# Patient Record
Sex: Male | Born: 1955 | Race: White | Hispanic: No | Marital: Single | Smoking: Former smoker
Health system: Southern US, Community
[De-identification: ages and names within clinical notes are randomized; demographics above are authoritative.]

## PROBLEM LIST (undated history)

## (undated) DIAGNOSIS — K644 Residual hemorrhoidal skin tags: Secondary | ICD-10-CM

## (undated) DIAGNOSIS — R7611 Nonspecific reaction to tuberculin skin test without active tuberculosis: Secondary | ICD-10-CM

## (undated) HISTORY — PX: OTHER SURGICAL HISTORY: SHX169

## (undated) HISTORY — PX: LEG SURGERY: SHX1003

## (undated) HISTORY — PX: EYE SURGERY: SHX253

---

## 2011-09-13 ENCOUNTER — Emergency Department (HOSPITAL_COMMUNITY)

## 2011-09-13 ENCOUNTER — Emergency Department (HOSPITAL_COMMUNITY)
Admission: EM | Admit: 2011-09-13 | Discharge: 2011-09-14 | Attending: Emergency Medicine | Admitting: Emergency Medicine

## 2011-09-13 ENCOUNTER — Other Ambulatory Visit: Payer: Self-pay

## 2011-09-13 DIAGNOSIS — R0602 Shortness of breath: Secondary | ICD-10-CM | POA: Insufficient documentation

## 2011-09-13 DIAGNOSIS — R002 Palpitations: Secondary | ICD-10-CM

## 2011-09-13 DIAGNOSIS — Z87891 Personal history of nicotine dependence: Secondary | ICD-10-CM | POA: Insufficient documentation

## 2011-09-13 DIAGNOSIS — J45909 Unspecified asthma, uncomplicated: Secondary | ICD-10-CM | POA: Insufficient documentation

## 2011-09-13 DIAGNOSIS — R079 Chest pain, unspecified: Secondary | ICD-10-CM | POA: Insufficient documentation

## 2011-09-13 HISTORY — DX: Nonspecific reaction to tuberculin skin test without active tuberculosis: R76.11

## 2011-09-13 HISTORY — DX: Residual hemorrhoidal skin tags: K64.4

## 2011-09-13 LAB — COMPREHENSIVE METABOLIC PANEL
ALT: 12 U/L (ref 0–53)
Alkaline Phosphatase: 52 U/L (ref 39–117)
CO2: 28 mEq/L (ref 19–32)
GFR calc Af Amer: >90 mL/min (ref >90)
GFR calc non Af Amer: 83 mL/min — ABNORMAL LOW (ref >90)
Glucose, Bld: 105 mg/dL — ABNORMAL HIGH (ref 70–99)
Potassium: 4 mEq/L (ref 3.5–5.1)
Sodium: 134 mEq/L — ABNORMAL LOW (ref 135–145)
Total Bilirubin: 0.4 mg/dL (ref 0.3–1.2)

## 2011-09-13 LAB — DIFFERENTIAL
Eosinophils Relative: 4 % (ref 0–5)
Lymphocytes Relative: 41 % (ref 12–46)
Lymphs Abs: 3.6 10*3/uL (ref 0.7–4.0)
Neutrophils Relative %: 47 % (ref 43–77)

## 2011-09-13 LAB — CBC
MCV: 90.2 fL (ref 78.0–100.0)
Platelets: 259 10*3/uL (ref 150–400)
RBC: 4.91 MIL/uL (ref 4.22–5.81)
WBC: 8.8 10*3/uL (ref 4.0–10.5)

## 2011-09-13 NOTE — ED Provider Notes (Signed)
History     CSN: [id number] Arrival date & time: 09/13/2011 10:10 PM   First MD Initiated Contact with Patient 09/13/11 2308      Chief Complaint  Patient presents with  . Hypertension  . Shortness of Breath    (Consider location/radiation/quality/duration/timing/severity/associated sxs/prior treatment) HPI Comments: Patient who is an inmate at a local prison,  c/o intermittent chest pain and "feeling like my heart is racing" several times throughout the day.  States that at 6 pm tonight the sx's became more persistent and he also noticed some shortness of breath during that time.  States that his blood pressure was also elevated.  He denies previous cardiac disease, nausea or vomiting, weakness or diaphoresis.  Also states that he was upset today because his girlfriend did not visit him today.    Patient is a 55 y.o. male presenting with palpitations. The history is provided by the patient.  Palpitations  This is a new problem. The current episode started 3 to 5 hours ago. Episode frequency: intermittently. The problem has been gradually improving. The problem is associated with an unknown factor. Associated symptoms include chest pain, irregular heartbeat and shortness of breath. Pertinent negatives include no diaphoresis, no fever, no numbness, no chest pressure, no near-syncope, no orthopnea, no syncope, no abdominal pain, no nausea, no vomiting, no headaches, no back pain, no leg pain, no lower extremity edema, no dizziness, no weakness, no cough and no hemoptysis. He has tried nothing for the symptoms. The treatment provided no relief. Risk factors include being male. His past medical history does not include heart disease or valve disorder.    Past Medical History  Diagnosis Date  . Hemorrhoids, external   . 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 Asthma     Past Surgical History  Procedure Date  . Eye surgery   . Blood clot      removed from left lung    No family history  on file.  History  Substance Use Topics  . Smoking status: Former Games developer  . Smokeless tobacco: Not on file  . Alcohol Use: Not on file      Review of Systems  Constitutional: Negative for fever, chills, diaphoresis and fatigue.  HENT: Negative for sore throat, trouble swallowing, neck pain and neck stiffness.   Respiratory: Positive for shortness of breath. Negative for cough, hemoptysis, chest tightness and wheezing.   Cardiovascular: Positive for chest pain and palpitations. Negative for orthopnea, leg swelling, syncope and near-syncope.  Gastrointestinal: Negative for nausea, vomiting, abdominal pain and blood in stool.  Genitourinary: Negative for dysuria, hematuria and flank pain.  Musculoskeletal: Negative for myalgias, back pain and arthralgias.  Skin: Negative for rash.  Neurological: Negative for dizziness, syncope, weakness, numbness and headaches.  Hematological: Does not bruise/bleed easily.  All other systems reviewed and are negative.    Allergies  Review of patient's allergies indicates no known allergies.  Home Medications   Current Outpatient Rx  Name Route Sig Dispense Refill  . IPRATROPIUM-ALBUTEROL 18-103 MCG/ACT IN AERO Inhalation Inhale 2 puffs into the lungs every 6 (six) hours as needed. For shortness of breath     . IBUPROFEN 200 MG PO TABS Oral Take 400 mg by mouth as needed. For pain       BP 126/93  Pulse 79  Temp(Src) 97.5 F (36.4 C) (Oral)  Resp 16  Ht 6' (1.829 m)  Wt 200 lb (90.719 kg)  BMI 27.12 kg/m2  SpO2 100%  Physical Exam  Nursing note and vitals reviewed. Constitutional: He is oriented to person, place, and time. He appears well-developed and well-nourished. No distress.  HENT:  Head: Normocephalic and atraumatic.  Mouth/Throat: Oropharynx is clear and moist.  Eyes: EOM are normal. Pupils are equal, round, and reactive to light.  Neck: Normal range of motion. Neck supple.  Cardiovascular: Normal rate, regular rhythm,  normal heart sounds and intact distal pulses.  Exam reveals no gallop and no friction rub.   No murmur heard. Pulmonary/Chest: Effort normal and breath sounds normal. No respiratory distress. He exhibits no tenderness.  Abdominal: Soft. He exhibits no distension. There is no tenderness.  Musculoskeletal: Normal range of motion. He exhibits no tenderness.  Lymphadenopathy:    He has no cervical adenopathy.  Neurological: He is alert and oriented to person, place, and time. He has normal reflexes. No cranial nerve deficit. He exhibits normal muscle tone. Coordination normal.  Skin: Skin is warm and dry.    ED Course  Procedures (including critical care time)   Dg Chest Portable 1 View  09/13/2011  *RADIOLOGY REPORT*  Clinical Data: Shortness of breath.  Former smoker.  PORTABLE CHEST - 1 VIEW  Comparison: None.  Findings: Extensive bilateral rib deformities noted with bridging callus.  Cardiac and mediastinal contours appear unremarkable.  The densities projecting over the lungs appear to be attributable to the rib deformities, and the lungs appear grossly clear.  IMPRESSION:  1.  Extensive bilateral rib deformities from healed rib fractures. Otherwise, no significant abnormality identified.  Original Report Authenticated By: Dellia Cloud, M.D.    Results for orders placed during the hospital encounter of 09/13/11  CBC      Component Value Range   WBC 8.8  4.0 - 10.5 (K/uL)   RBC 4.91  4.22 - 5.81 (MIL/uL)   Hemoglobin 15.0  13.0 - 17.0 (g/dL)   HCT 16.1  09.6 - 04.5 (%)   MCV 90.2  78.0 - 100.0 (fL)   MCH 30.5  26.0 - 34.0 (pg)   MCHC 33.9  30.0 - 36.0 (g/dL)   RDW 40.9  81.1 - 91.4 (%)   Platelets 259  150 - 400 (K/uL)  DIFFERENTIAL      Component Value Range   Neutrophils Relative 47  43 - 77 (%)   Neutro Abs 4.2  1.7 - 7.7 (K/uL)   Lymphocytes Relative 41  12 - 46 (%)   Lymphs Abs 3.6  0.7 - 4.0 (K/uL)   Monocytes Relative 8  3 - 12 (%)   Monocytes Absolute 0.7  0.1 -  1.0 (K/uL)   Eosinophils Relative 4  0 - 5 (%)   Eosinophils Absolute 0.3  0.0 - 0.7 (K/uL)   Basophils Relative 1  0 - 1 (%)   Basophils Absolute 0.1  0.0 - 0.1 (K/uL)  POCT I-STAT TROPONIN I      Component Value Range   Troponin i, poc 0.00  0.00 - 0.08 (ng/mL)   Comment 3                MDM       Date: 09/13/2011  Rate: 70  Rhythm: normal sinus rhythm  QRS Axis: normal  Intervals: normal  ST/T Wave abnormalities: normal  Conduction Disutrbances:none  Narrative Interpretation:   Old EKG Reviewed: none available     1:07 AM Vitals remain stable.  No tachycardia or palpations during ED stay.  Chest pain and SOB had improved prior to ED arrival. I have discussed  tonight's findings with the EDP and the patient.  I have advised him and the accompanying officer that he will need to f/u with the facility physician and may need to be scheduled for a Holter monitor if the palpitations persist.    Patient / Family / Caregiver understand and agree with initial ED impression and plan with expectations set for ED visit.   Mary Secord L. [place], [place] 09/14/11 [phone]

## 2011-09-13 NOTE — ED Notes (Signed)
Pt from jail, became sob around 6pm, stated feeling better now, bp was also elevated at that time

## 2011-09-14 NOTE — ED Notes (Signed)
Pt left with the guard stating no needs

## 2011-09-14 NOTE — ED Notes (Signed)
Pt stating no needs no pain.

## 2011-09-15 NOTE — ED Provider Notes (Signed)
Medical screening examination/treatment/procedure(s) were performed by non-physician practitioner and as supervising physician I was immediately available for consultation/collaboration.  Nicoletta Dress. Colon Branch, MD 09/15/11 [phone]

## 2011-10-09 ENCOUNTER — Emergency Department (HOSPITAL_COMMUNITY)
Admission: EM | Admit: 2011-10-09 | Discharge: 2011-10-09 | Disposition: A | Discharge status: Home / Self Care | Attending: Emergency Medicine | Admitting: Emergency Medicine

## 2011-10-09 ENCOUNTER — Other Ambulatory Visit: Payer: Self-pay

## 2011-10-09 ENCOUNTER — Encounter (HOSPITAL_COMMUNITY): Payer: Self-pay | Admitting: *Deleted

## 2011-10-09 ENCOUNTER — Emergency Department (HOSPITAL_COMMUNITY)

## 2011-10-09 DIAGNOSIS — R000 Tachycardia, unspecified: Secondary | ICD-10-CM

## 2011-10-09 DIAGNOSIS — Z87891 Personal history of nicotine dependence: Secondary | ICD-10-CM | POA: Insufficient documentation

## 2011-10-09 DIAGNOSIS — Z79899 Other long term (current) drug therapy: Secondary | ICD-10-CM | POA: Insufficient documentation

## 2011-10-09 LAB — DIFFERENTIAL
Basophils Absolute: 0 10*3/uL (ref 0.0–0.1)
Basophils Relative: 1 % (ref 0–1)
Eosinophils Relative: 4 % (ref 0–5)
Lymphocytes Relative: 34 % (ref 12–46)
Monocytes Absolute: 0.6 10*3/uL (ref 0.1–1.0)
Monocytes Relative: 8 % (ref 3–12)

## 2011-10-09 LAB — URINALYSIS, ROUTINE W REFLEX MICROSCOPIC
Glucose, UA: NEGATIVE mg/dL
Hgb urine dipstick: NEGATIVE
Ketones, ur: NEGATIVE mg/dL
Protein, ur: NEGATIVE mg/dL

## 2011-10-09 LAB — CBC
HCT: 44.2 % (ref 39.0–52.0)
MCHC: 33.7 g/dL (ref 30.0–36.0)
MCV: 90.8 fL (ref 78.0–100.0)
RDW: 13.1 % (ref 11.5–15.5)

## 2011-10-09 LAB — BASIC METABOLIC PANEL
BUN: 17 mg/dL (ref 6–23)
CO2: 32 mEq/L (ref 19–32)
Calcium: 10.1 mg/dL (ref 8.4–10.5)
Creatinine, Ser: 1.04 mg/dL (ref 0.50–1.35)

## 2011-10-09 LAB — CARDIAC PANEL(CRET KIN+CKTOT+MB+TROPI): Total CK: 259 U/L — ABNORMAL HIGH (ref 7–232)

## 2011-10-09 NOTE — ED Provider Notes (Signed)
History     CSN: [id number] Arrival date & time: 10/09/2011 12:42 PM   First MD Initiated Contact with Patient 10/09/11 1303      Chief Complaint  Patient presents with  . Tachycardia    (Consider location/radiation/quality/duration/timing/severity/associated sxs/prior treatment) HPI Comments: Anthony Hobbs is a 55 y.o. male who presents to the Emergency Department complaining of rapid heart rate. Patient is incarcerated at a local prison until 09/2012. He was eating lunch when his heart began to race. EMS was called. They found the patient in SVT, rate 170. He was without symptoms other than rapid heart rate. He was given a single dose of adenosine and his heart rate slowed to 105. He was transported to the ER. Patient states he has had several episodes of rapid heart rate that have come on at rest. He was told previously they may have been caused by stress however he was not under stress with this episode or several of the previous episodes. His only medication is his inhaler. He quit smoking 14 months ago.   Patient is a 55 y.o. male presenting with palpitations. The history is provided by the patient and the EMS personnel.  Palpitations  This is a new problem. The current episode started less than 1 hour ago. The problem occurs constantly. The problem has been resolved. Associated with: nothing. Pertinent negatives include no diaphoresis, no fever, no chest pain, no chest pressure, no near-syncope, no nausea, no vomiting and no headaches. The treatment provided significant (adenosine by EMS) relief. His past medical history does not include heart disease or hyperthyroidism.    Past Medical History  Diagnosis Date  . Hemorrhoids, external   . 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 Asthma     Past Surgical History  Procedure Date  . Eye surgery   . Blood clot      removed from left lung    History reviewed. No pertinent family history.  History  Substance Use Topics  .  Smoking status: Former Games developer  . Smokeless tobacco: Not on file  . Alcohol Use: No     hasn't drank in 14 months      Review of Systems  Constitutional: Negative for fever and diaphoresis.  Cardiovascular: Positive for palpitations. Negative for chest pain and near-syncope.  Gastrointestinal: Negative for nausea and vomiting.  Neurological: Negative for headaches.  All other systems reviewed and are negative.    Allergies  Review of patient's allergies indicates no known allergies.  Home Medications   Current Outpatient Rx  Name Route Sig Dispense Refill  . IPRATROPIUM-ALBUTEROL 18-103 MCG/ACT IN AERO Inhalation Inhale 2 puffs into the lungs every 6 (six) hours as needed. For shortness of breath     . IBUPROFEN 200 MG PO TABS Oral Take 400 mg by mouth as needed. For pain       BP 122/92  Pulse 104  Temp(Src) 97.4 F (36.3 C) (Oral)  Resp 18  Ht 6' (1.829 m)  Wt 200 lb (90.719 kg)  BMI 27.12 kg/m2  SpO2 96%  Physical Exam  Nursing note and vitals reviewed. Constitutional: He is oriented to person, place, and time. He appears well-developed and well-nourished.  HENT:  Head: Normocephalic and atraumatic.       Edentulous except for upper incisors  Eyes: EOM are normal.  Neck: Normal range of motion.  Cardiovascular: Normal rate, normal heart sounds and intact distal pulses.   Pulmonary/Chest: Effort normal and breath sounds normal.  Abdominal: Soft.  Bowel sounds are normal.  Musculoskeletal: Normal range of motion.  Neurological: He is alert and oriented to person, place, and time. He has normal reflexes.  Skin: Skin is warm and dry.    ED Course  Procedures (including critical care time)   Dg Chest Port 1 View  10/09/2011  *RADIOLOGY REPORT*  Clinical Data: Tachycardia, asthma  PORTABLE CHEST - 1 VIEW  Comparison: 09/13/2011  Findings: Lungs are clear. No pleural effusion or pneumothorax.  Cardiomediastinal silhouette is within normal limits.  Stable  bilateral rib deformities.  IMPRESSION: No evidence of acute cardiopulmonary disease.  Original Report Authenticated By: Charline Bills, M.D.   Results for orders placed during the hospital encounter of 10/09/11  URINALYSIS, ROUTINE W REFLEX MICROSCOPIC      Component Value Range   Color, Urine YELLOW  YELLOW    Appearance CLEAR  CLEAR    Specific Gravity, Urine 1.015  1.005 - 1.030    pH 6.0  5.0 - 8.0    Glucose, UA NEGATIVE  NEGATIVE (mg/dL)   Hgb urine dipstick NEGATIVE  NEGATIVE    Bilirubin Urine NEGATIVE  NEGATIVE    Ketones, ur NEGATIVE  NEGATIVE (mg/dL)   Protein, ur NEGATIVE  NEGATIVE (mg/dL)   Urobilinogen, UA 0.2  0.0 - 1.0 (mg/dL)   Nitrite NEGATIVE  NEGATIVE    Leukocytes, UA NEGATIVE  NEGATIVE   CBC      Component Value Range   WBC 7.8  4.0 - 10.5 (K/uL)   RBC 4.87  4.22 - 5.81 (MIL/uL)   Hemoglobin 14.9  13.0 - 17.0 (g/dL)   HCT 81.1  91.4 - 78.2 (%)   MCV 90.8  78.0 - 100.0 (fL)   MCH 30.6  26.0 - 34.0 (pg)   MCHC 33.7  30.0 - 36.0 (g/dL)   RDW 95.6  21.3 - 08.6 (%)   Platelets 243  150 - 400 (K/uL)  DIFFERENTIAL      Component Value Range   Neutrophils Relative 54  43 - 77 (%)   Neutro Abs 4.2  1.7 - 7.7 (K/uL)   Lymphocytes Relative 34  12 - 46 (%)   Lymphs Abs 2.6  0.7 - 4.0 (K/uL)   Monocytes Relative 8  3 - 12 (%)   Monocytes Absolute 0.6  0.1 - 1.0 (K/uL)   Eosinophils Relative 4  0 - 5 (%)   Eosinophils Absolute 0.3  0.0 - 0.7 (K/uL)   Basophils Relative 1  0 - 1 (%)   Basophils Absolute 0.0  0.0 - 0.1 (K/uL)  BASIC METABOLIC PANEL      Component Value Range   Sodium 143  135 - 145 (mEq/L)   Potassium 5.0  3.5 - 5.1 (mEq/L)   Chloride 105  96 - 112 (mEq/L)   CO2 32  19 - 32 (mEq/L)   Glucose, Bld 106 (*) 70 - 99 (mg/dL)   BUN 17  6 - 23 (mg/dL)   Creatinine, Ser 5.78  0.50 - 1.35 (mg/dL)   Calcium 46.9  8.4 - 10.5 (mg/dL)   GFR calc non Af Amer 80 (*) >90 (mL/min)   GFR calc Af Amer >90  >90 (mL/min)  CARDIAC PANEL(CRET KIN+CKTOT+MB+TROPI)       Component Value Range   Total CK 259 (*) 7 - 232 (U/L)   CK, MB 5.4 (*) 0.3 - 4.0 (ng/mL)   Troponin I <0.30  <0.30 (ng/mL)   Relative Index 2.1  0.0 - 2.5     Date: 10/09/2011  1231  Rate: 105  Rhythm: sinus tachycardia  QRS Axis: normal  Intervals: normal  ST/T Wave abnormalities: normal  Conduction Disutrbances:none  Narrative Interpretation:   Old EKG Reviewed: changes noted tachycardic otherwise no change since 09/13/11   No diagnosis found.    MDM  Patient who presents post treatment for SVT. From the patient history, he has had similar tachycardia before but has not seen a cardiologist or been started on any medications. Will have the prison make appointment with cardiology for further work up. Patient has remained stable in the ER. Labs are unremarkable. HR has decreased from 105 to the 80's. Pt stable in ED with no significant deterioration in condition.The patient appears reasonably screened and/or stabilized for discharge and I doubt any other medical condition or other [facility] requiring further screening, evaluation, or treatment in the ED at this time prior to discharge.  MDM Reviewed: nursing note and vitals Reviewed previous: labs, ECG and x-ray Interpretation: labs, ECG and x-ray           Aurther Loft S. Colon Branch, MD 10/09/11 [phone]

## 2011-10-09 NOTE — ED Notes (Signed)
Report given to Sherre Lain RN at [facility]

## 2011-10-09 NOTE — ED Notes (Signed)
Pt brought in by [facility] for c/o increased heart rate; pt states he was getting ready to eat lunch and felt like his heart was racing; [facility] arrived and pt found to have hr of 170; pt was given adenosine 6mg  and hr came down to 102; EKG done by EMS and no elevations to be found; pt denies any pain

## 2012-02-12 ENCOUNTER — Other Ambulatory Visit: Payer: Self-pay

## 2012-02-12 ENCOUNTER — Emergency Department (HOSPITAL_COMMUNITY)

## 2012-02-12 ENCOUNTER — Encounter (HOSPITAL_COMMUNITY): Payer: Self-pay | Admitting: Emergency Medicine

## 2012-02-12 ENCOUNTER — Emergency Department (HOSPITAL_COMMUNITY)
Admission: EM | Admit: 2012-02-12 | Discharge: 2012-02-13 | Disposition: A | Discharge status: Home / Self Care | Attending: Emergency Medicine | Admitting: Emergency Medicine

## 2012-02-12 DIAGNOSIS — M25519 Pain in unspecified shoulder: Secondary | ICD-10-CM | POA: Insufficient documentation

## 2012-02-12 DIAGNOSIS — M542 Cervicalgia: Secondary | ICD-10-CM | POA: Insufficient documentation

## 2012-02-12 DIAGNOSIS — M79609 Pain in unspecified limb: Secondary | ICD-10-CM | POA: Insufficient documentation

## 2012-02-12 DIAGNOSIS — J45909 Unspecified asthma, uncomplicated: Secondary | ICD-10-CM | POA: Insufficient documentation

## 2012-02-12 DIAGNOSIS — R079 Chest pain, unspecified: Secondary | ICD-10-CM | POA: Insufficient documentation

## 2012-02-12 LAB — TROPONIN I: Troponin I: <0.3 ng/mL (ref <0.30)

## 2012-02-12 LAB — POCT I-STAT TROPONIN I: Troponin i, poc: 0 ng/mL (ref 0.00–0.08)

## 2012-02-12 LAB — COMPREHENSIVE METABOLIC PANEL
BUN: 25 mg/dL — ABNORMAL HIGH (ref 6–23)
CO2: 28 mEq/L (ref 19–32)
Calcium: 9.6 mg/dL (ref 8.4–10.5)
Chloride: 101 mEq/L (ref 96–112)
Creatinine, Ser: 0.9 mg/dL (ref 0.50–1.35)
GFR calc Af Amer: >90 mL/min (ref >90)
GFR calc non Af Amer: >90 mL/min (ref >90)
Glucose, Bld: 87 mg/dL (ref 70–99)
Total Bilirubin: 0.3 mg/dL (ref 0.3–1.2)

## 2012-02-12 LAB — DIFFERENTIAL
Eosinophils Relative: 4 % (ref 0–5)
Lymphocytes Relative: 38 % (ref 12–46)
Lymphs Abs: 2.9 10*3/uL (ref 0.7–4.0)
Monocytes Absolute: 0.5 10*3/uL (ref 0.1–1.0)
Monocytes Relative: 7 % (ref 3–12)
Neutro Abs: 3.9 10*3/uL (ref 1.7–7.7)

## 2012-02-12 LAB — CBC
HCT: 41.6 % (ref 39.0–52.0)
Hemoglobin: 14.3 g/dL (ref 13.0–17.0)
MCV: 90 fL (ref 78.0–100.0)
RBC: 4.62 MIL/uL (ref 4.22–5.81)
RDW: 13.2 % (ref 11.5–15.5)
WBC: 7.7 10*3/uL (ref 4.0–10.5)

## 2012-02-12 LAB — LIPASE, BLOOD: Lipase: 48 U/L (ref 11–59)

## 2012-02-12 LAB — D-DIMER, QUANTITATIVE: D-Dimer, Quant: 0.58 ug/mL-FEU — ABNORMAL HIGH (ref 0.00–0.48)

## 2012-02-12 MED ORDER — IOHEXOL 350 MG/ML SOLN
100.0000 mL | Freq: Once | INTRAVENOUS | Status: AC | PRN
  Administered 2012-02-12: 100 mL via INTRAVENOUS

## 2012-02-12 MED ORDER — SODIUM CHLORIDE 0.9 % IV SOLN: Freq: Once | INTRAVENOUS | Status: DC

## 2012-02-12 NOTE — ED Notes (Signed)
Pt c/o intermittent chest pain x 2 days. Pt states he feels pressure in his central chest, has assoc SOB and some nausea.

## 2012-02-12 NOTE — ED Provider Notes (Signed)
History   This chart was scribed for Toy Baker, MD scribed by Magnus Sinning. The patient was seen in room APA11/APA11 seen 20:56.     CSN: [id number]  Arrival date & time 02/12/12  2043   First MD Initiated Contact with Patient 02/12/12 2051      Chief Complaint  Patient presents with  . Chest Pain    (Consider location/radiation/quality/duration/timing/severity/associated sxs/prior treatment) HPI Anthony Hobbs is a 56 y.o. male who presents to the Emergency Department complaining of intermittent moderate achy sharp CP located in center of his chest, onset a couple days. He states typically pain last 1-2 hours, but that current pain has lasted 4-5 hours. He adds that the pain radiates to his arms, shoulders, and neck. Aggravated when he is walking around, and standing, but relieved after laying down. Pt was given 4 asa prior to ED and says he used also inhaler. Denies sob, diaphoresis, HA , fever, or hx of heart issues. Pt was seen 5 months ago for rapid heart beat.  Past Medical History  Diagnosis Date  . Hemorrhoids, external   . 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 Asthma     Past Surgical History  Procedure Date  . Eye surgery   . Blood clot      removed from left lung  . Leg surgery     Family History  Problem Relation Age of Onset  . Heart failure Mother     History  Substance Use Topics  . Smoking status: Former Games developer  . Smokeless tobacco: Not on file  . Alcohol Use: No     hasn't drank in 14 months      Review of Systems  Cardiovascular: Positive for chest pain.  All other systems reviewed and are negative.   10 Systems reviewed and are negative for acute change except as noted in the HPI. Allergies  Review of patient's allergies indicates no known allergies.  Home Medications   Current Outpatient Rx  Name Route Sig Dispense Refill  . IPRATROPIUM-ALBUTEROL 18-103 MCG/ACT IN AERO Inhalation Inhale 2 puffs into the lungs every 6 (six)  hours as needed. For shortness of breath     . IBUPROFEN 200 MG PO TABS Oral Take 400 mg by mouth as needed. For pain       BP 139/90  Pulse 64  Temp(Src) 98 F (36.7 C) (Oral)  Resp 20  Ht 6' (1.829 m)  Wt 206 lb (93.441 kg)  BMI 27.94 kg/m2  SpO2 98%  Physical Exam  Nursing note and vitals reviewed. Constitutional: He is oriented to person, place, and time. He appears well-developed and well-nourished. No distress.  HENT:  Head: Normocephalic and atraumatic.  Eyes: EOM are normal. Pupils are equal, round, and reactive to light.  Neck: Neck supple. No tracheal deviation present.  Cardiovascular: Normal rate, regular rhythm and normal heart sounds.   Pulmonary/Chest: Effort normal. No respiratory distress.  Abdominal: Soft. He exhibits no distension.  Musculoskeletal: Normal range of motion. He exhibits no edema.  Neurological: He is alert and oriented to person, place, and time. No sensory deficit.  Skin: Skin is warm and dry.  Psychiatric: He has a normal mood and affect. His behavior is normal.    ED Course  Procedures (including critical care time) DIAGNOSTIC STUDIES: Oxygen Saturation is 98% on room air, normal by my interpretation.    COORDINATION OF CARE: 21:00: 0.9 % sodium chloride infusion Once  Labs Reviewed  COMPREHENSIVE METABOLIC PANEL -  Abnormal; Notable for the following:    BUN 25 (*)    All other components within normal limits  D-DIMER, QUANTITATIVE - Abnormal; Notable for the following:    D-Dimer, Quant 0.58 (*)    All other components within normal limits  CBC  DIFFERENTIAL  LIPASE, BLOOD  TROPONIN I  POCT I-STAT TROPONIN I   Dg Chest 2 View  02/12/2012  *RADIOLOGY REPORT*  Clinical Data: Pain  CHEST - 2 VIEW  Comparison: 10/09/2011  Findings: Heart size is normal.  No pleural effusion or pulmonary edema.  Coarsened interstitial markings are noted bilaterally.  No superimposed airspace consolidation identified.  Chronic bilateral rib fracture  deformities are identified.  IMPRESSION:  1.  No acute cardiopulmonary abnormalities. 2.  Bilateral chronic rib fractures.  Original Report Authenticated By: Rosealee Albee, M.D.   Ct Angio Chest W/cm &/or Wo Cm  02/12/2012  *RADIOLOGY REPORT*  Clinical Data: Chest pain  CT ANGIOGRAPHY CHEST  Technique:  Multidetector CT imaging of the chest using the standard protocol during bolus administration of intravenous contrast. Multiplanar reconstructed images including MIPs were obtained and reviewed to evaluate the vascular anatomy.  Contrast: OMNIPAQUE IOHEXOL 350 MG/ML IV SOLN  Comparison: None.  Findings: No enlarged supraclavicular or axillary adenopathy.  There is a precarinal lymph node which is borderline enlarged measuring 1.3 cm, image 39.  No hilar adenopathy.  No pericardial or pleural effusion.  No abnormal filling defects within the main pulmonary artery or its branches to suggest an acute pulmonary embolus.  The lungs are clear.  There is no airspace consolidation.  Review of the visualized osseous structures shows extensive bilateral posterior rib fractures which appear chronic.  Limited imaging through the upper abdomen is negative.  IMPRESSION:  1.  No acute cardiopulmonary abnormalities.  No evidence for acute pulmonary embolus. 2.  Borderline enlarged precarinal lymph nodes.  Original Report Authenticated By: Rosealee Albee, M.D.     No diagnosis found.    MDM   Date: 02/12/2012  Rate: 62  Rhythm: normal sinus rhythm  QRS Axis: normal  Intervals: normal  ST/T Wave abnormalities: normal  Conduction Disutrbances:none  Narrative Interpretation:   Old EKG Reviewed: unchanged   I personally performed the services described in this documentation, which was scribed in my presence. The recorded information has been reviewed and considered.  Patient given aspirin prior to arrival. Patient is workup for PE negative. Patient will be admitted to [facility] for treatment  for observation         Toy Baker, MD 02/12/12 2337

## 2012-02-12 NOTE — ED Notes (Signed)
I-stat attempted earlier. Test failed to register on first attempt.

## 2012-02-13 MED ORDER — ACETAMINOPHEN 500 MG PO TABS
ORAL_TABLET | ORAL | Status: AC
  Administered 2012-02-13: 1000 mg
  Filled 2012-02-13: qty 2

## 2012-02-13 MED ORDER — METOPROLOL SUCCINATE ER 25 MG PO TB24: 12.5000 mg | ORAL_TABLET | Freq: Every day | ORAL | Status: AC

## 2012-02-13 MED ORDER — ASPIRIN 81 MG PO CHEW: 81.0000 mg | CHEWABLE_TABLET | Freq: Every day | ORAL | Status: AC

## 2012-02-13 NOTE — ED Provider Notes (Signed)
Patient was seen by Dr. Orvan Falconer from [facility].  He does not feel patient meets criteria for admission.  He will be discharged to home (jail) with cardiology follow up.  Geoffery Lyons, MD 02/13/12 [phone]

## 2012-02-13 NOTE — Discharge Instructions (Signed)
Chest Pain (Nonspecific) It is often hard to give a specific diagnosis for the cause of chest pain. There is always a chance that your pain could be related to something serious, such as a heart attack or a blood clot in the lungs. You need to follow up with your caregiver for further evaluation. CAUSES   Heartburn.   Pneumonia or bronchitis.   Anxiety or stress.   Inflammation around your heart (pericarditis) or lung (pleuritis or pleurisy).   A blood clot in the lung.   A collapsed lung (pneumothorax). It can develop suddenly on its own (spontaneous pneumothorax) or from injury (trauma) to the chest.   Shingles infection (herpes zoster virus).  The chest wall is composed of bones, muscles, and cartilage. Any of these can be the source of the pain.  The bones can be bruised by injury.   The muscles or cartilage can be strained by coughing or overwork.   The cartilage can be affected by inflammation and become sore (costochondritis).  DIAGNOSIS  Lab tests or other studies, such as X-rays, electrocardiography, stress testing, or cardiac imaging, may be needed to find the cause of your pain.  TREATMENT   Treatment depends on what may be causing your chest pain. Treatment may include:   Acid blockers for heartburn.   Anti-inflammatory medicine.   Pain medicine for inflammatory conditions.   Antibiotics if an infection is present.   You may be advised to change lifestyle habits. This includes stopping smoking and avoiding alcohol, caffeine, and chocolate.   You may be advised to keep your head raised (elevated) when sleeping. This reduces the chance of acid going backward from your stomach into your esophagus.   Most of the time, nonspecific chest pain will improve within 2 to 3 days with rest and mild pain medicine.  HOME CARE INSTRUCTIONS   If antibiotics were prescribed, take your antibiotics as directed. Finish them even if you start to feel better.   For the next few  days, avoid physical activities that bring on chest pain. Continue physical activities as directed.   Do not smoke.   Avoid drinking alcohol.   Only take over-the-counter or prescription medicine for pain, discomfort, or fever as directed by your caregiver.   Follow your caregiver's suggestions for further testing if your chest pain does not go away.   Keep any follow-up appointments you made. If you do not go to an appointment, you could develop lasting (chronic) problems with pain. If there is any problem keeping an appointment, you must call to reschedule.  SEEK MEDICAL CARE IF:   You think you are having problems from the medicine you are taking. Read your medicine instructions carefully.   Your chest pain does not go away, even after treatment.   You develop a rash with blisters on your chest.  SEEK IMMEDIATE MEDICAL CARE IF:   You have increased chest pain or pain that spreads to your arm, neck, jaw, back, or abdomen.   You develop shortness of breath, an increasing cough, or you are coughing up blood.   You have severe back or abdominal pain, feel nauseous, or vomit.   You develop severe weakness, fainting, or chills.   You have a fever.  THIS IS AN EMERGENCY. Do not wait to see if the pain will go away. Get medical help at once. Call your local emergency services (911 in [place] Do not drive yourself to the hospital. MAKE SURE YOU:   Understand these instructions.     Will watch your condition.   Will get help right away if you are not doing well or get worse.  Document Released: 08/20/2005 Document Revised: 10/30/2011 Document Reviewed: 06/15/2008 [employer] Patient Information 2012 [employer]. 

## 2012-02-13 NOTE — Consult Note (Signed)
Emergency room consult  Patient ID: Anthony Hobbs MRN: [id number] DOB/AGE: 1956/03/01 56 y.o. Primary Care Physician: [facility] Consult date:  02/13/2012  Physicians requesting the consult: Lorre Nick M.D.;  Geoffery Lyons, M.D.  Reason for consult : Chest pain    Impression: persistent chest pain for 2 days   history of asthma   history of paroxysmal tachycardia on digoxin  Recommendations: Patient has been having pain 2 days, and his EKG and troponin remained remained completely normal, he can be discharged home in care of correctional facility physician, to make an early followup with a cardiologist for outpatient cardiac stress testing.  Recommend he start a daily baby aspirin, and a low-dose beta blocker. He can take Tylenol when necessary for pain.  He should avoid exertion until he has been cleared by cardiology.      TAKE these medications         aspirin 81 MG chewable tablet   Chew 1 tablet (81 mg total) by mouth daily.      metoprolol succinate 25 MG 24 hr tablet   Commonly known as: TOPROL-XL   Take 0.5 tablets (12.5 mg total) by mouth daily.              continue home medications     albuterol-ipratropium 18-103 MCG/ACT inhaler   Commonly known as: COMBIVENT   Inhale 2 puffs into the lungs every 6 (six) hours as needed. For shortness of breath      digoxin 0.25 MG tablet   Commonly known as: LANOXIN   Take 250 mcg by mouth daily.      magnesium oxide 400 MG tablet   Commonly known as: MAG-OX   Take 400 mg by mouth 2 (two) times daily.      polycarbophil 625 MG tablet   Commonly known as: FIBERCON   Take 1,350 mg by mouth daily.            history of present illness   is a 56 year old Caucasian gent, resident of [facility], was brought to the emergency room with a history of severe central chest pain persisting for the past 2 days sometimes relieved by  lying flat. And no dizziness or diaphoresis no nausea  or vomiting or palpitations.  Pain is not aggravated by movement or deep breathing; he has no fever cough or cold; he denies any history of chest trauma.  He has no prior coronary artery disease history; no diabetes or hypertension ; was seen in the emergency room in November 2012 for SVT aborted with adenosine, and was advised to followup with cardiology at that time. He reports that she has not had cardiology f/u.  Currently patient reports that he is comfortable and chest pain free.  On a complete review of systems he denies any other problems.  Physical exam: Blood pressure 124/89, pulse 71, temperature 98 F (36.7 C), temperature source Oral, resp. rate 18, height 6' (1.829 m), weight 93.441 kg (206 lb), SpO2 97.00%.  General: Pleasant middle-aged Caucasian gentleman lying on the stretcher; he is in no distress his affect is appropriate he is alert and oriented x3;he has a deep tan to skin.  HEENT: Head is atraumatic and normocephalic; pupils are round equal and reactive; no thyromegaly no carotid bruit or JVD. Chest: Clear to auscultation bilaterally no chest wall tenderness Heart: Regular rhythm no murmurs rubs or gallops Abdomen: Mildly obese soft nontender no masses normal, bowel sounds Extremities: Age-appropriate arthropathy of the hands and  knees; no edema; no rash Skin: Generalized deep tanning; no rash or ulcers CNS: Cranial nerves 2-12 grossly intact no focal lateralizing sign  Significant Diagnostic Studies: Dg Chest 2 View  02/12/2012  *RADIOLOGY REPORT*  Clinical Data: Pain  CHEST - 2 VIEW  Comparison: 10/09/2011  Findings: Heart size is normal.  No pleural effusion or pulmonary edema.  Coarsened interstitial markings are noted bilaterally.  No superimposed airspace consolidation identified.  Chronic bilateral rib fracture deformities are identified.  IMPRESSION:  1.  No acute cardiopulmonary abnormalities. 2.  Bilateral chronic rib fractures.  Original Report  Authenticated By: Rosealee Albee, M.D.   Ct Angio Chest W/cm &/or Wo Cm  02/12/2012  *RADIOLOGY REPORT*  Clinical Data: Chest pain  CT ANGIOGRAPHY CHEST  Technique:  Multidetector CT imaging of the chest using the standard protocol during bolus administration of intravenous contrast. Multiplanar reconstructed images including MIPs were obtained and reviewed to evaluate the vascular anatomy.  Contrast: OMNIPAQUE IOHEXOL 350 MG/ML IV SOLN  Comparison: None.  Findings: No enlarged supraclavicular or axillary adenopathy.  There is a precarinal lymph node which is borderline enlarged measuring 1.3 cm, image 39.  No hilar adenopathy.  No pericardial or pleural effusion.  No abnormal filling defects within the main pulmonary artery or its branches to suggest an acute pulmonary embolus.  The lungs are clear.  There is no airspace consolidation.  Review of the visualized osseous structures shows extensive bilateral posterior rib fractures which appear chronic.  Limited imaging through the upper abdomen is negative.  IMPRESSION:  1.  No acute cardiopulmonary abnormalities.  No evidence for acute pulmonary embolus. 2.  Borderline enlarged precarinal lymph nodes.  Original Report Authenticated By: Rosealee Albee, M.D.    Lab Results: Basic Metabolic Panel:  [facility] 02/12/12 2054  NA 136  K 4.0  CL 101  CO2 28  GLUCOSE 87  BUN 25*  CREATININE 0.90  CALCIUM 9.6  MG --  PHOS --   Liver Function Tests:  [facility] 02/12/12 2054  AST 20  ALT 10  ALKPHOS 52  BILITOT 0.3  PROT 7.2  ALBUMIN 4.0     CBC:  [facility] 02/12/12 2054  WBC 7.7  NEUTROABS 3.9  HGB 14.3  HCT 41.6  MCV 90.0  PLT 241      Follow-up Information    Follow up with [facility]. Schedule an appointment as soon as possible for a visit in 2 days.   Contact information:   [address] [place] [address] [phone]         Signed: Nafeesah Lapaglia Pager [phone]  02/13/2012,  12:44 AM

## 2013-04-25 DIAGNOSIS — R0789 Other chest pain: Secondary | ICD-10-CM

## 2013-05-23 IMAGING — CR DG CHEST 1V PORT
1 series · 1 of 1 positions shown · non-contrast
Comparison: None.

CLINICAL DATA: Shortness of breath.  Former smoker.

PORTABLE CHEST - 1 VIEW

[view not recorded]
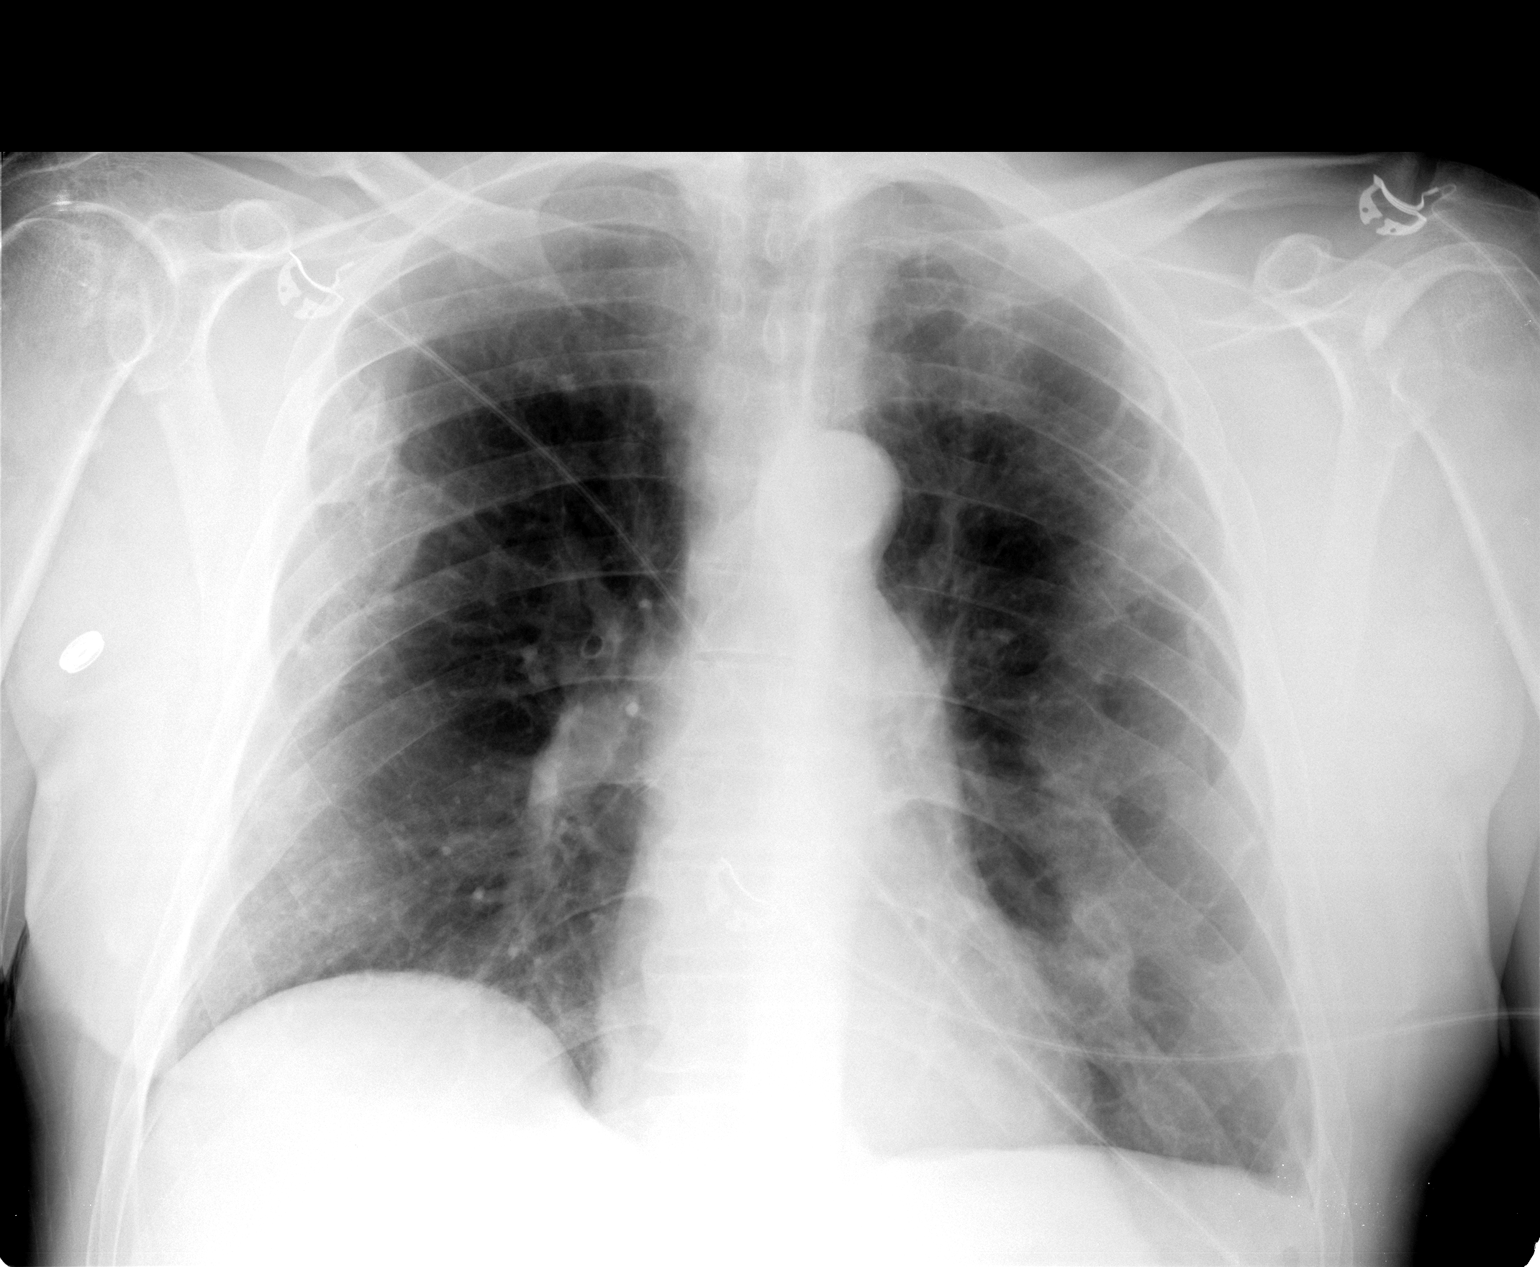

[1 of 1 positions shown; findings below may reference images not displayed]

FINDINGS: Extensive bilateral rib deformities noted with bridging
callus.

Cardiac and mediastinal contours appear unremarkable.

The densities projecting over the lungs appear to be attributable
to the rib deformities, and the lungs appear grossly clear.
IMPRESSION: 1.  Extensive bilateral rib deformities from healed rib fractures.
Otherwise, no significant abnormality identified.

## 2013-06-18 IMAGING — CR DG CHEST 1V PORT
1 series · 1 of 1 positions shown · non-contrast
Comparison: 09/13/2011

CLINICAL DATA: Tachycardia, asthma

PORTABLE CHEST - 1 VIEW

[view not recorded]
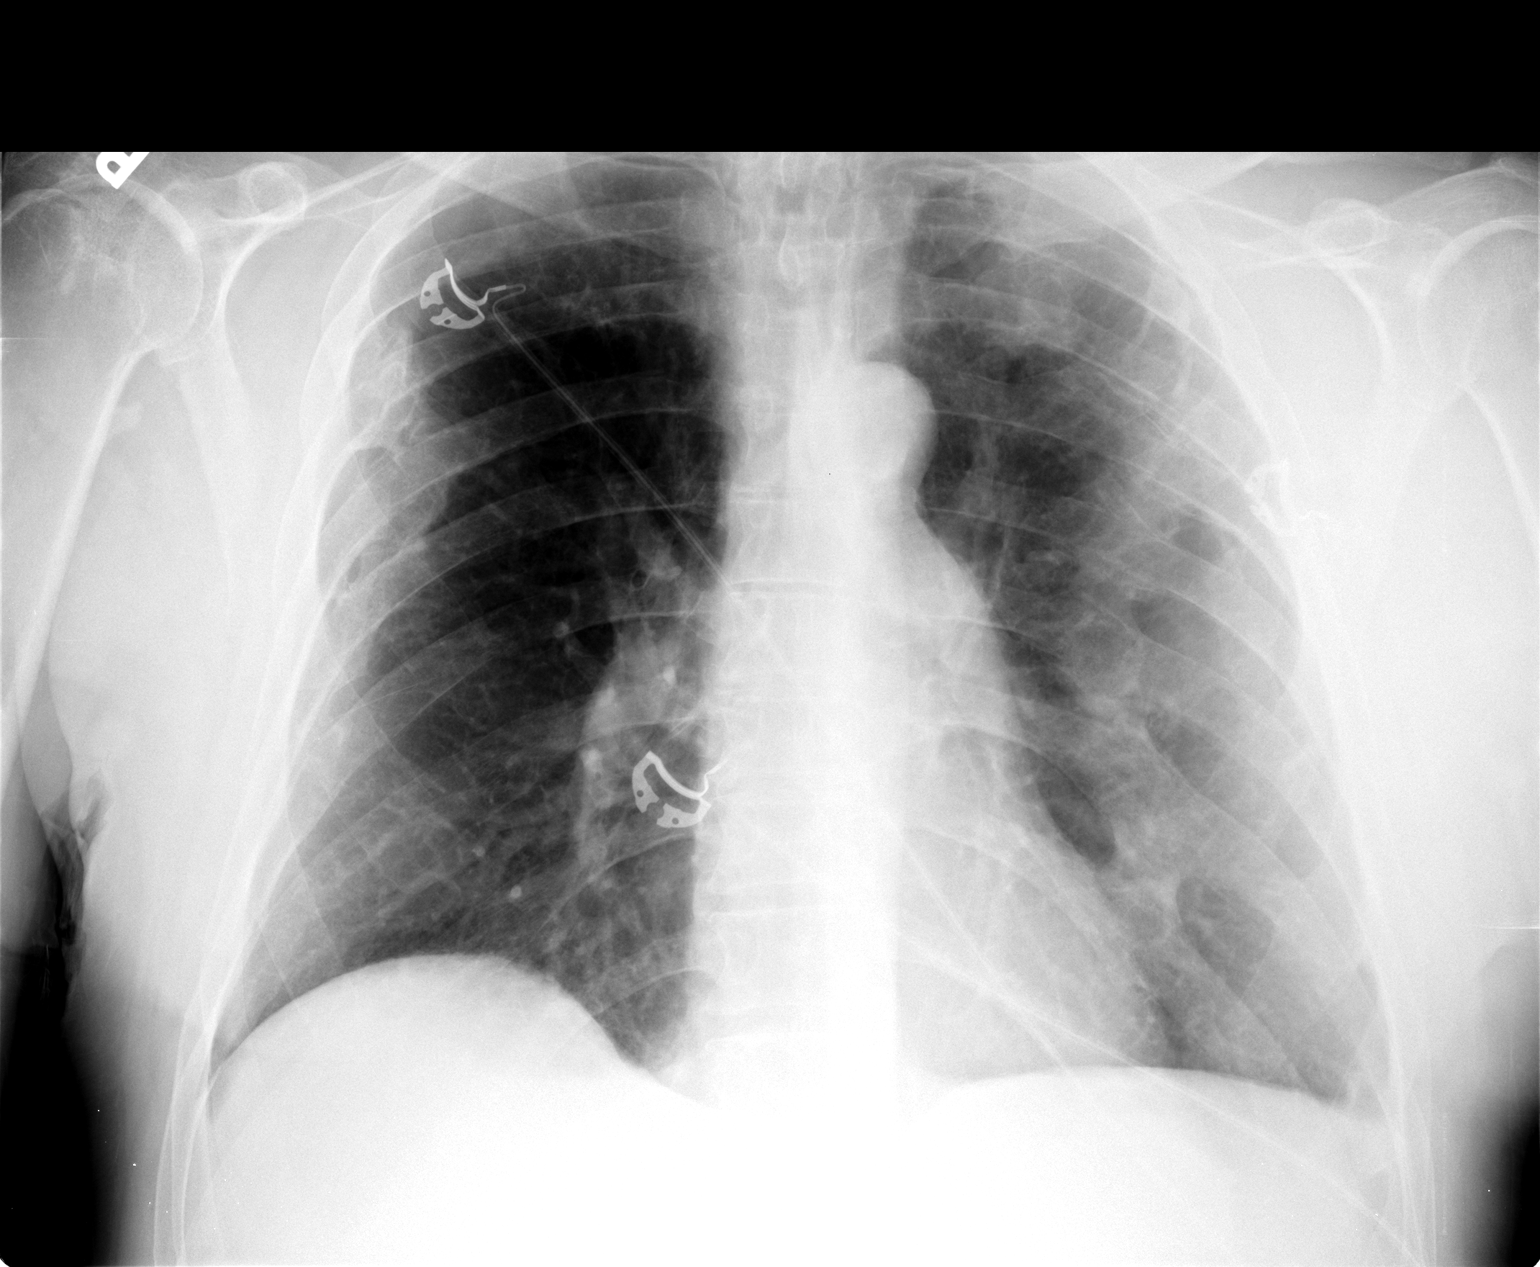

[1 of 1 positions shown; findings below may reference images not displayed]

FINDINGS: Lungs are clear. No pleural effusion or pneumothorax.

Cardiomediastinal silhouette is within normal limits.

Stable bilateral rib deformities.
IMPRESSION: No evidence of acute cardiopulmonary disease.

## 2020-03-29 ENCOUNTER — Telehealth: Payer: Self-pay

## 2020-03-29 NOTE — Telephone Encounter (Signed)
This patient called and stated that he has surgery in [facility] and had a stent that he was suppose to pull out today but the string is missing. He asked if we could see him regarding this issue. I asked the nurse, [facility] and she said he would need to contact the provider that did his procedure in [place]. I advised the patient accordingly and he understood.

## 2022-02-20 ENCOUNTER — Encounter: Payer: Self-pay | Admitting: Urology

## 2022-02-20 ENCOUNTER — Ambulatory Visit (INDEPENDENT_AMBULATORY_CARE_PROVIDER_SITE_OTHER): Payer: Medicaid Other | Admitting: Urology

## 2022-02-20 VITALS — BP 137/86 | HR 54

## 2022-02-20 DIAGNOSIS — B3742 Candidal balanitis: Secondary | ICD-10-CM | POA: Diagnosis not present

## 2022-02-20 LAB — URINALYSIS, ROUTINE W REFLEX MICROSCOPIC
Bilirubin, UA: NEGATIVE
Glucose, UA: NEGATIVE
Ketones, UA: NEGATIVE
Leukocytes,UA: NEGATIVE
Nitrite, UA: NEGATIVE
Protein,UA: NEGATIVE
RBC, UA: NEGATIVE
Specific Gravity, UA: 1.02 (ref 1.005–1.030)
Urobilinogen, Ur: 1 mg/dL (ref 0.2–1.0)
pH, UA: 6 (ref 5.0–7.5)

## 2022-02-20 MED ORDER — NYSTATIN 100000 UNIT/GM EX OINT: 1.0000 "application " | TOPICAL_OINTMENT | Freq: Two times a day (BID) | CUTANEOUS | 1 refills | Status: AC

## 2022-02-20 NOTE — Progress Notes (Signed)
? ?  Assessment: ?1. Candidal balanitis   ? ? ?Plan: ?His exam is consistent with Candida balanitis.  He does not have any significant phimosis. ?Recommend topical antifungal ointment for management. ?I discussed circumcision but at the present time this does not appear to be indicated. ?Nystatin ointment to glans twice daily ?Return to office in 4-6 weeks. ? ?Chief Complaint:  ?Chief Complaint  ?Patient presents with  ? Circumcision  ? ? ?History of Present Illness: ? ?Anthony Hobbs is a 66 y.o. year old male who is seen in consultation from Toma Deiters, MD for evaluation of phimosis and balanitis.  He is uncircumcised.  He has noted a rash on the dorsal glans for a number of months.  He is able to retract his foreskin without difficulty.  No pain associated with the rash or with retraction of the foreskin.  He has tried topical antibiotic ointment without improvement.  No history of prior treatment for balanitis.  He is not having any urinary symptoms.  No dysuria or gross hematuria. ? ? ?Past Medical History:  ?Past Medical History:  ?Diagnosis Date  ? Asthma   ? Hemorrhoids, external   ? 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 ? ?Past Surgical History:  ?Past Surgical History:  ?Procedure Laterality Date  ? blood clot     ? removed from left lung  ? EYE SURGERY    ? LEG SURGERY    ? ? ?Allergies:  ?No Known Allergies ? ?Family History:  ?Family History  ?Problem Relation Age of Onset  ? Heart failure Mother   ? ? ?Social History:  ?Social History  ? ?Tobacco Use  ? Smoking status: Former  ?Substance Use Topics  ? Alcohol use: No  ?  Comment: hasn't drank in 14 months  ? Drug use: No  ? ? ?Review of symptoms:  ?Constitutional:  Negative for unexplained weight loss, night sweats, fever, chills ?ENT:  Negative for nose bleeds, sinus pain, painful swallowing ?CV:  Negative for chest pain, shortness of breath, exercise intolerance, palpitations, loss of consciousness ?Resp:  Negative for cough, wheezing,  shortness of breath ?GI:  Negative for nausea, vomiting, diarrhea, bloody stools ?GU:  Positives noted in HPI; otherwise negative for gross hematuria, dysuria, urinary incontinence ?Neuro:  Negative for seizures, poor balance, limb weakness, slurred speech ?Psych:  Negative for lack of energy, depression, anxiety ?Endocrine:  Negative for polydipsia, polyuria, symptoms of hypoglycemia (dizziness, hunger, sweating) ?Hematologic:  Negative for anemia, purpura, petechia, prolonged or excessive bleeding, use of anticoagulants  ?Allergic:  Negative for difficulty breathing or choking as a result of exposure to anything; no shellfish allergy; no allergic response (rash/itch) to materials, foods ? ?Physical exam: ?BP 137/86   Pulse (!) 54  ?GENERAL APPEARANCE:  Well appearing, well developed, well nourished, NAD ?HEENT: Atraumatic, Normocephalic, oropharynx clear. ?NECK: Supple without lymphadenopathy or thyromegaly. ?LUNGS: Clear to auscultation bilaterally. ?HEART: Regular Rate and Rhythm without murmurs, gallops, or rubs. ?ABDOMEN: Soft, non-tender, No Masses. ?EXTREMITIES: Moves all extremities well.  Without clubbing, cyanosis, or edema. ?NEUROLOGIC:  Alert and oriented x 3, normal gait, CN II-XII grossly intact.  ?MENTAL STATUS:  Appropriate. ?BACK:  Non-tender to palpation.  No CVAT ?SKIN:  Warm, dry and intact.   ?GU: ?Penis: Uncircumcised; foreskin easily retracts; erythematous area on dorsal glans ?Meatus: Normal ?Scrotum: normal, no masses ?Testis: normal without masses bilateral ?Epididymis: normal ? ? ?Results: ?U/A dipstick negative ? ?

## 2022-04-02 ENCOUNTER — Ambulatory Visit: Payer: Medicaid Other | Admitting: Urology

## 2022-04-02 NOTE — Progress Notes (Deleted)
   Assessment: 1. Candidal balanitis      Plan: His exam is consistent with Candida balanitis.  He does not have any significant phimosis. Recommend topical antifungal ointment for management. I discussed circumcision but at the present time this does not appear to be indicated. Nystatin ointment to glans twice daily Return to office in 4-6 weeks.  Chief Complaint:  No chief complaint on file.   History of Present Illness:  Anthony Hobbs is a 66 y.o. year old male who is seen for continued evaluation of phimosis and balanitis.  He is uncircumcised.  At his initial visit in March 2023, he had noted a rash on the dorsal glans for a number of months.  He was able to retract his foreskin without difficulty.  No pain associated with the rash or with retraction of the foreskin.  He had tried topical antibiotic ointment without improvement.  No history of prior treatment for balanitis.  He was not having any urinary symptoms.  No dysuria or gross hematuria. His exam was consistent with Candida balanitis.  He was treated with nystatin ointment.  He returns today for follow-up.  Portions of the above documentation were copied from a prior visit for review purposes only.    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     Past Surgical History:  Past Surgical History:  Procedure Laterality Date   blood clot      removed from left lung   EYE SURGERY     LEG SURGERY      Allergies:  Allergies  Allergen Reactions   Amoxapine And Related Nausea Only    Family History:  Family History  Problem Relation Age of Onset   Heart failure Mother     Social History:  Social History   Tobacco Use   Smoking status: Former  Substance Use Topics   Alcohol use: No    Comment: hasn't drank in 14 months   Drug use: No    ROS: Constitutional:  Negative for fever, chills, weight loss CV:  Negative for chest pain, previous MI, hypertension Respiratory:  Negative for shortness of breath, wheezing, sleep apnea, frequent cough GI:  Negative for nausea, vomiting, bloody stool, GERD   Physical exam: There were no vitals taken for this visit. GENERAL APPEARANCE:  Well appearing, well developed, well nourished, NAD HEENT:  Atraumatic, normocephalic, oropharynx clear NECK:  Supple without lymphadenopathy or thyromegaly ABDOMEN:  Soft, non-tender, no masses EXTREMITIES:  Moves all extremities well, without clubbing, cyanosis, or edema NEUROLOGIC:  Alert and oriented x 3, normal gait, CN II-XII grossly intact MENTAL STATUS:  appropriate BACK:  Non-tender to palpation, No CVAT SKIN:  Warm, dry, and intact GU: Penis:  {Exam; penis:5791} Meatus: {Meatus:15530} Scrotum: {pe scrotum:310183} Testis: {Exam; testicles:5790} Epididymis: {epididymis EVOJ:500938} Prostate: {Exam; prostate:5793} Rectum: {rectal exam:26517}   Results: U/A dipstick negative

## 2023-11-22 DIAGNOSIS — F17210 Nicotine dependence, cigarettes, uncomplicated: Secondary | ICD-10-CM | POA: Diagnosis not present

## 2023-11-22 DIAGNOSIS — M25512 Pain in left shoulder: Secondary | ICD-10-CM | POA: Diagnosis not present

## 2023-11-22 DIAGNOSIS — J449 Chronic obstructive pulmonary disease, unspecified: Secondary | ICD-10-CM | POA: Diagnosis not present

## 2023-11-22 DIAGNOSIS — I10 Essential (primary) hypertension: Secondary | ICD-10-CM | POA: Diagnosis not present

## 2023-11-22 DIAGNOSIS — M19012 Primary osteoarthritis, left shoulder: Secondary | ICD-10-CM | POA: Diagnosis not present

## 2023-12-22 DIAGNOSIS — I10 Essential (primary) hypertension: Secondary | ICD-10-CM | POA: Diagnosis not present

## 2023-12-22 DIAGNOSIS — J449 Chronic obstructive pulmonary disease, unspecified: Secondary | ICD-10-CM | POA: Diagnosis not present

## 2023-12-22 DIAGNOSIS — I25110 Atherosclerotic heart disease of native coronary artery with unstable angina pectoris: Secondary | ICD-10-CM | POA: Diagnosis not present

## 2023-12-22 DIAGNOSIS — E7849 Other hyperlipidemia: Secondary | ICD-10-CM | POA: Diagnosis not present

## 2023-12-22 DIAGNOSIS — K219 Gastro-esophageal reflux disease without esophagitis: Secondary | ICD-10-CM | POA: Diagnosis not present

## 2024-01-01 DIAGNOSIS — F17210 Nicotine dependence, cigarettes, uncomplicated: Secondary | ICD-10-CM | POA: Diagnosis not present

## 2024-01-01 DIAGNOSIS — I2510 Atherosclerotic heart disease of native coronary artery without angina pectoris: Secondary | ICD-10-CM | POA: Diagnosis not present

## 2024-01-01 DIAGNOSIS — I700 Atherosclerosis of aorta: Secondary | ICD-10-CM | POA: Diagnosis not present

## 2024-01-01 DIAGNOSIS — I288 Other diseases of pulmonary vessels: Secondary | ICD-10-CM | POA: Diagnosis not present

## 2024-01-01 DIAGNOSIS — J439 Emphysema, unspecified: Secondary | ICD-10-CM | POA: Diagnosis not present

## 2024-01-01 DIAGNOSIS — Z122 Encounter for screening for malignant neoplasm of respiratory organs: Secondary | ICD-10-CM | POA: Diagnosis not present

## 2024-01-28 DIAGNOSIS — R319 Hematuria, unspecified: Secondary | ICD-10-CM | POA: Diagnosis not present

## 2024-01-28 DIAGNOSIS — N390 Urinary tract infection, site not specified: Secondary | ICD-10-CM | POA: Diagnosis not present

## 2024-01-28 DIAGNOSIS — R109 Unspecified abdominal pain: Secondary | ICD-10-CM | POA: Diagnosis not present

## 2024-01-28 DIAGNOSIS — R3911 Hesitancy of micturition: Secondary | ICD-10-CM | POA: Diagnosis not present

## 2024-01-28 DIAGNOSIS — Z79899 Other long term (current) drug therapy: Secondary | ICD-10-CM | POA: Diagnosis not present

## 2024-01-28 DIAGNOSIS — K5730 Diverticulosis of large intestine without perforation or abscess without bleeding: Secondary | ICD-10-CM | POA: Diagnosis not present

## 2024-01-28 DIAGNOSIS — J449 Chronic obstructive pulmonary disease, unspecified: Secondary | ICD-10-CM | POA: Diagnosis not present

## 2024-01-28 DIAGNOSIS — F17210 Nicotine dependence, cigarettes, uncomplicated: Secondary | ICD-10-CM | POA: Diagnosis not present

## 2024-01-28 DIAGNOSIS — N200 Calculus of kidney: Secondary | ICD-10-CM | POA: Diagnosis not present

## 2024-01-28 DIAGNOSIS — Z7902 Long term (current) use of antithrombotics/antiplatelets: Secondary | ICD-10-CM | POA: Diagnosis not present

## 2024-01-28 DIAGNOSIS — Z888 Allergy status to other drugs, medicaments and biological substances status: Secondary | ICD-10-CM | POA: Diagnosis not present

## 2024-01-28 DIAGNOSIS — I10 Essential (primary) hypertension: Secondary | ICD-10-CM | POA: Diagnosis not present

## 2024-01-28 DIAGNOSIS — Z7951 Long term (current) use of inhaled steroids: Secondary | ICD-10-CM | POA: Diagnosis not present

## 2024-01-28 NOTE — ED Provider Notes (Signed)
 Emergency Department Provider Note    ED Clinical Impression   Final diagnoses:  Kidney stone (Primary)  Acute UTI  Hematuria, unspecified type    ED Assessment/Plan    Condition: Stable Disposition: Discharge  This chart has been completed using Dragon Medical Dictation software, and while attempts have been made to ensure accuracy, certain words and phrases may not be transcribed as intended.   History   Chief Complaint  Patient presents with  . Difficulty Urinating  . Hematuria   HPI  Anthony Hobbs is a 68 y.o. male  who presents today to the  emergency department complaining of hematuria, difficulty with his stream.  Patient did have slight flank pain yesterday but denies any pain at this time.  He thinks he may have passed a kidney stone.  He denies any nausea or vomiting.  He describes symptoms as moderate.  There are no obvious aggravating or relieving factors.    Allergies: is allergic to amoxapine and amoxapine analogues (dibenzoxazepines). Medications: has a current medication list which includes the following long-term medication(s): atorvastatin, olmesartan, symbicort, albuterol, amlodipine, and metoprolol  tartrate. PMHx:  has a past medical history of Arthritis, COPD (chronic obstructive pulmonary disease) (CMS-HCC), Hypertension, and Kidney stone. PSHx:  has no past surgical history on file. SocHx:  reports that he has been smoking cigarettes. He started smoking about 50 years ago. He has a 50 pack-year smoking history. He does not have any smokeless tobacco history on file. He reports current drug use. Drug: Marijuana. He reports that he does not drink alcohol. Allergies, Medications, Medical, Surgical, and Social History were reviewed as documented above.   Social Drivers of Health with Concerns   Food Insecurity: Not on file  Internet Connectivity: Not on file   Housing/Utilities: Not on file  Tobacco Use: High Risk (01/11/2024)   Patient History   . Smoking Tobacco Use: Every Day   . Smokeless Tobacco Use: Unknown   . Passive Exposure: Not on file  Transportation Needs: Not on file  Alcohol Use: Not on file  Interpersonal Safety: Not on file  Physical Activity: Not on file  Intimate Partner Violence: Not on file  Stress: Not on file  Substance Use: Not on file (10/04/2023)  Social Connections: Not on file  Financial Resource Strain: Not on file  Health Literacy: Not on file     Review Of Systems  Review of Systems  Constitutional:  Negative for fever.  HENT:  Negative for congestion.   Respiratory:  Negative for chest tightness and shortness of breath.   Cardiovascular:  Negative for chest pain.  Gastrointestinal:  Negative for abdominal pain.  Genitourinary:  Positive for dysuria, flank pain and hematuria.  Skin:  Negative for color change.  Psychiatric/Behavioral:  Negative for behavioral problems.   All other systems reviewed and are negative.   Physical Exam   BP 124/80   Pulse 77   Temp 36.5 C (97.7 F) (Temporal)   Resp 16   Ht 182.9 cm (6')   Wt 89.4 kg (197 lb 3.2 oz)   SpO2 95%   BMI 26.75 kg/m   Physical Exam Vitals and nursing note reviewed.  Constitutional:      General: He is not in acute distress. HENT:     Head: Normocephalic.  Eyes:  Conjunctiva/sclera: Conjunctivae normal.  Cardiovascular:     Rate and Rhythm: Regular rhythm.     Pulses: Normal pulses.     Heart sounds: Normal heart sounds.  Pulmonary:     Effort: No respiratory distress.     Breath sounds: Normal breath sounds.  Abdominal:     General: There is no distension.     Tenderness: There is no abdominal tenderness. There is no right CVA tenderness, left CVA tenderness, guarding or rebound.     Comments: Abdomen soft, nontender, normoactive bowel sounds.  No CVA tenderness.  Musculoskeletal:        General: No deformity.   Skin:    General: Skin is warm.     Capillary Refill: Capillary refill takes 2 to 3 seconds.     Comments: Normal cap refill.  Neurological:     General: No focal deficit present.  Psychiatric:        Mood and Affect: Mood normal.     ED Course  Medical Decision Making Differential illness includes renal colic versus UTI/cystitis versus bladder mass.  Will get CT scan.  Will check basic labs.  1:07 PM Patient reevaluated.  Patient is doing well.  Labs and imaging reviewed.  Will put him on Keflex for his UTI.  Patient is stable for discharge.  I have reviewed my clinical findings and studies and my clinical impression with the patient. The patient has expressed understanding that at this time there is no evidence for a more malignant underlying process, but the patient also understands that early in the process of a condition such as this, an initial workup can be falsely reassuring. I have counseled the patient and discussed follow-up with the patient, stressing the importance of appropriate follow-up. I have also counseled the patient to return if worse or any concerns. Routine discharge counseling was given to the patient and the patient understands that worsening, changing or persistent symptoms should prompt an immediate call or follow up with their primary physician or return to the emergency department for reevaluation. Patient has expressed understanding.     Problems Addressed: Acute UTI: acute illness or injury that poses a threat to life or bodily functions Hematuria, unspecified type: acute illness or injury that poses a threat to life or bodily functions Kidney stone: acute illness or injury  Amount and/or Complexity of Data Reviewed Labs: ordered. Decision-making details documented in ED Course. Radiology: ordered. Decision-making details documented in ED Course.  Risk Prescription drug management. Decision regarding hospitalization.     Procedures   No results  found for this visit on 01/28/24 (from the past 4464 hours).   ED Results Results for orders placed or performed during the hospital encounter of 01/28/24  Magnesium Level  Result Value Ref Range   Magnesium 1.7 1.6 - 2.6 mg/dL  Basic Metabolic Panel  Result Value Ref Range   Sodium 144 135 - 145 mmol/L   Potassium 4.2 3.5 - 5.0 mmol/L   Chloride 106 98 - 107 mmol/L   CO2 33.2 (H) 21.0 - 32.0 mmol/L   Anion Gap 5 3 - 11 mmol/L   BUN 16 8 - 20 mg/dL   Creatinine 9.13 9.19 - 1.30 mg/dL   BUN/Creatinine Ratio 19    eGFR CKD-EPI (2021) Male >90 >=60 mL/min/1.25m2   Glucose 104 70 - 179 mg/dL   Calcium 9.0 8.5 - 89.8 mg/dL  Urinalysis with Microscopy with Culture Reflex  Result Value Ref Range   Color, UA Yellow  Clarity, UA Clear Clear   Specific Gravity, UA 1.020 1.010 - 1.025   pH, UA 6.0 5.0 - 8.0   Leukocyte Esterase, UA Moderate (A) Negative   Nitrite, UA Positive (A) Negative   Protein, UA 30 mg/dL (A) Negative   Glucose, UA Negative Negative   Ketones, UA Negative Negative   Urobilinogen, UA 1.0 mg/dL 0.1 - 1.0 mg/dL   Bilirubin, UA Negative Negative   Blood, UA Large (A) Negative   RBC, UA >100 (H) 0 - 5 /HPF   WBC, UA 5 <=5 /HPF   Bacteria, UA Moderate (A) Few, Occasional, Small, None Seen /HPF  CBC w/ Differential  Result Value Ref Range   WBC 8.4 4.0 - 10.5 10*9/L   RBC 4.68 4.10 - 5.60 10*12/L   HGB 14.8 12.5 - 17.0 g/dL   HCT 55.7 63.9 - 49.9 %   MCV 94.4 80.0 - 98.0 fL   MCH 31.6 27.0 - 34.0 pg   MCHC 33.5 32.0 - 36.0 g/dL   RDW 86.3 88.4 - 85.4 %   MPV 9.3 7.4 - 10.4 fL   Platelet 221 140 - 415 10*9/L   Neutrophils % 62.6 %   Lymphocytes % 25.6 %   Monocytes % 9.0 %   Eosinophils % 1.3 %   Basophils % 1.1 %   Absolute Neutrophils 5.3 1.8 - 7.8 10*9/L   Absolute Lymphocytes 2.2 0.7 - 4.5 10*9/L   Absolute Monocytes 0.8 0.1 - 1.0 10*9/L   Absolute Eosinophils 0.1 0.0 - 0.4 10*9/L   Absolute Basophils 0.1 0.0 - 0.2 10*9/L   CT Abdomen Pelvis Wo  Contrast Result Date: 01/28/2024 Exam:  CT of the Abdomen and Pelvis without IV contrast History:  Difficulty voiding, flank pain Technique: Axial, sagittal, and coronal images were reconstructed from helical CT through the abdomen and pelvis without IV contrast with the patient in prone position. AEC (automated exposure control) and/or manual techniques such as size-specific kV and mAs are employed where appropriate to reduce radiation exposure for all CT exams. Oral Contrast:  None.  Comparison:  02/08/2020 Findings: Abdomen: The lung bases are unremarkable. The liver, gallbladder, and spleen are normal in appearance. There are no visible abnormalities of the stomach or small bowel. The appendix is not specifically identified on this exam, however, there is no radiographic evidence of inflammation in the right lower quadrant. There is diverticulosis in the descending and sigmoid portions of the colon without diverticulitis. There is no bowel obstruction or pneumoperitoneum. There is no free intraperitoneal fluid. Retroperitoneum: There is no perinephric fluid. There is no hydronephrosis. There is no right nephrolithiasis. There is an 11 mm stone in the left renal collecting system. There is no hydroureter or ureterolithiasis, however, there is a 12 mm stone in the bladder. There is no abnormal retroperitoneal mass or fluid collection. The pancreas and adrenal glands are normal in appearance. There is atherosclerosis of the aorta and common iliac arteries. There is foraminal narrowing at the L4 and L5 levels due to chronic degenerative changes. The lumbar spine is otherwise unremarkable. Pelvis: There is a 12 mm stone in the bladder. There is no abnormal mass or fluid collection in the pelvis. There is sigmoid diverticulosis without evidence of diverticulitis. There is an intramedullary nail in the right femoral diaphysis. The proximal femurs are otherwise unremarkable. The bony pelvis is unremarkable.   1.    11  mm stone in the left renal collecting system. 2.    12 mm stone in  the bladder. 3.    No hydronephrosis or hydroureter. 4.    Diverticulosis in the descending and sigmoid portions of the colon without evidence of diverticulitis. Signed (Electronic Signature): 01/28/2024 11:01 AM Signed By: Aleene JONELLE Cerise, MD   Medications Administered:  Medications  cephalexin (KEFLEX) capsule 500 mg (has no administration in time range)  sodium chloride  0.9% (NS) bolus 1,000 mL (1,000 mL Intravenous New Bag 01/28/24 1025)    Discharge Medications (Medications Prescribed during this  ED visit and Patient's Home Medications) :    Your Medication List     START taking these medications    cephalexin 500 MG capsule Commonly known as: KEFLEX Take 1 capsule (500 mg total) by mouth Three (3) times a day for 10 days.       ASK your doctor about these medications    albuterol 90 mcg/actuation inhaler Commonly known as: PROVENTIL HFA;VENTOLIN HFA Inhale 2 puffs every four (4) hours as needed for wheezing or shortness of breath.   amlodipine 10 MG tablet Commonly known as: NORVASC Take 1 tablet (10 mg total) by mouth daily.   aspirin -acetaminophen  500-325 mg Pack Take 1 dose pack by mouth daily as needed (pain).   atorvastatin 40 MG tablet Commonly known as: LIPITOR Take 1 tablet (40 mg total) by mouth daily.   isosorbide dinitrate 20 MG tablet Commonly known as: ISORDIL Take 1 tablet (20 mg total) by mouth daily.   methocarbamol 500 MG tablet Commonly known as: ROBAXIN Take 1,000 mg by mouth four (4) times a day.   metoPROLOL  tartrate 25 MG tablet Commonly known as: Lopressor  Take 1 tablet (25 mg total) by mouth two (2) times a day.   naproxen 500 MG tablet Commonly known as: NAPROSYN Take 500 mg by mouth 2 (two) times a day with meals.   olmesartan 40 MG tablet Commonly known as: BENICAR Take 1 tablet (40 mg total) by mouth daily.   SYMBICORT 160-4.5 mcg/actuation inhaler Generic drug:  budesonide-formoterol Inhale 2 puffs  in the morning and 2 puffs in the evening.          Cherie Ardeen Hanger, MD 01/28/24 [id number]

## 2024-02-22 DIAGNOSIS — J440 Chronic obstructive pulmonary disease with acute lower respiratory infection: Secondary | ICD-10-CM | POA: Diagnosis not present

## 2024-03-22 DIAGNOSIS — I272 Pulmonary hypertension, unspecified: Secondary | ICD-10-CM | POA: Diagnosis not present

## 2024-03-22 DIAGNOSIS — J449 Chronic obstructive pulmonary disease, unspecified: Secondary | ICD-10-CM | POA: Diagnosis not present

## 2024-03-22 DIAGNOSIS — J440 Chronic obstructive pulmonary disease with acute lower respiratory infection: Secondary | ICD-10-CM | POA: Diagnosis not present

## 2024-03-22 DIAGNOSIS — Z6824 Body mass index (BMI) 24.0-24.9, adult: Secondary | ICD-10-CM | POA: Diagnosis not present

## 2024-03-22 DIAGNOSIS — E7849 Other hyperlipidemia: Secondary | ICD-10-CM | POA: Diagnosis not present

## 2024-03-22 DIAGNOSIS — Z0000 Encounter for general adult medical examination without abnormal findings: Secondary | ICD-10-CM | POA: Diagnosis not present

## 2024-03-22 DIAGNOSIS — K219 Gastro-esophageal reflux disease without esophagitis: Secondary | ICD-10-CM | POA: Diagnosis not present

## 2024-03-22 DIAGNOSIS — I10 Essential (primary) hypertension: Secondary | ICD-10-CM | POA: Diagnosis not present

## 2024-05-10 DIAGNOSIS — Z6824 Body mass index (BMI) 24.0-24.9, adult: Secondary | ICD-10-CM | POA: Diagnosis not present

## 2024-05-10 DIAGNOSIS — I10 Essential (primary) hypertension: Secondary | ICD-10-CM | POA: Diagnosis not present

## 2024-05-10 DIAGNOSIS — J440 Chronic obstructive pulmonary disease with acute lower respiratory infection: Secondary | ICD-10-CM | POA: Diagnosis not present

## 2024-06-08 DIAGNOSIS — I10 Essential (primary) hypertension: Secondary | ICD-10-CM | POA: Diagnosis not present

## 2024-06-08 DIAGNOSIS — J440 Chronic obstructive pulmonary disease with acute lower respiratory infection: Secondary | ICD-10-CM | POA: Diagnosis not present

## 2024-06-21 DIAGNOSIS — I10 Essential (primary) hypertension: Secondary | ICD-10-CM | POA: Diagnosis not present

## 2024-06-21 DIAGNOSIS — Z6824 Body mass index (BMI) 24.0-24.9, adult: Secondary | ICD-10-CM | POA: Diagnosis not present

## 2024-06-21 DIAGNOSIS — J440 Chronic obstructive pulmonary disease with acute lower respiratory infection: Secondary | ICD-10-CM | POA: Diagnosis not present

## 2024-06-21 DIAGNOSIS — Z0000 Encounter for general adult medical examination without abnormal findings: Secondary | ICD-10-CM | POA: Diagnosis not present

## 2024-06-22 ENCOUNTER — Encounter (INDEPENDENT_AMBULATORY_CARE_PROVIDER_SITE_OTHER): Payer: Self-pay | Admitting: *Deleted

## 2024-08-01 DIAGNOSIS — J440 Chronic obstructive pulmonary disease with acute lower respiratory infection: Secondary | ICD-10-CM | POA: Diagnosis not present

## 2024-08-01 DIAGNOSIS — R0602 Shortness of breath: Secondary | ICD-10-CM | POA: Diagnosis not present

## 2024-08-01 DIAGNOSIS — E7849 Other hyperlipidemia: Secondary | ICD-10-CM | POA: Diagnosis not present

## 2024-08-01 DIAGNOSIS — R188 Other ascites: Secondary | ICD-10-CM | POA: Diagnosis not present

## 2024-08-05 DIAGNOSIS — R160 Hepatomegaly, not elsewhere classified: Secondary | ICD-10-CM | POA: Diagnosis not present

## 2024-08-05 DIAGNOSIS — R188 Other ascites: Secondary | ICD-10-CM | POA: Diagnosis not present

## 2024-08-05 DIAGNOSIS — N210 Calculus in bladder: Secondary | ICD-10-CM | POA: Diagnosis not present

## 2024-08-05 DIAGNOSIS — N200 Calculus of kidney: Secondary | ICD-10-CM | POA: Diagnosis not present

## 2024-08-15 DIAGNOSIS — J440 Chronic obstructive pulmonary disease with acute lower respiratory infection: Secondary | ICD-10-CM | POA: Diagnosis not present

## 2024-08-15 DIAGNOSIS — R188 Other ascites: Secondary | ICD-10-CM | POA: Diagnosis not present

## 2024-08-15 DIAGNOSIS — Z6824 Body mass index (BMI) 24.0-24.9, adult: Secondary | ICD-10-CM | POA: Diagnosis not present

## 2024-08-15 DIAGNOSIS — I5022 Chronic systolic (congestive) heart failure: Secondary | ICD-10-CM | POA: Diagnosis not present

## 2024-08-26 ENCOUNTER — Encounter: Payer: Self-pay | Admitting: Urology

## 2024-08-26 ENCOUNTER — Ambulatory Visit (INDEPENDENT_AMBULATORY_CARE_PROVIDER_SITE_OTHER): Admitting: Urology

## 2024-08-26 VITALS — BP 123/73 | HR 62

## 2024-08-26 DIAGNOSIS — N210 Calculus in bladder: Secondary | ICD-10-CM

## 2024-08-26 DIAGNOSIS — N200 Calculus of kidney: Secondary | ICD-10-CM

## 2024-08-26 LAB — URINALYSIS, ROUTINE W REFLEX MICROSCOPIC
Bilirubin, UA: NEGATIVE
Glucose, UA: NEGATIVE
Ketones, UA: NEGATIVE
Nitrite, UA: NEGATIVE
Specific Gravity, UA: 1.025 (ref 1.005–1.030)
Urobilinogen, Ur: 1 mg/dL (ref 0.2–1.0)
pH, UA: 6 (ref 5.0–7.5)

## 2024-08-26 LAB — MICROSCOPIC EXAMINATION
RBC, Urine: >30 /HPF — AB (ref 0–2)
WBC, UA: >30 /HPF — AB (ref 0–5)

## 2024-08-26 MED ORDER — TAMSULOSIN HCL 0.4 MG PO CAPS
0.4000 mg | ORAL_CAPSULE | Freq: Every day | ORAL | 11 refills | Status: AC
Start: 2024-08-26 — End: ?

## 2024-08-26 NOTE — Progress Notes (Signed)
 08/26/2024 9:42 AM   Venus L Noecker [birth year] [id number]  Referring provider: Orpha Yancey LABOR, MD [address] [place],  [place] [address]  Bladder stone   HPI: Mr Shepard is a 35bn here for evaluation of nephrolithiasis and a bladder calculus. IPSS 23 QOL 5 on no BPh therapy. He is having bothersome urinary urgency, frequency, and straining to urinate. No hematuria. No history of UTI. CT from 01/27/2024 shows a 1.5cm bladder calculus. Nocturia 3-5x. No dysuria.    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 If you have any questions, ask your nurse or doctor.          albuterol (2.5 MG/3ML) 0.083% nebulizer solution Commonly known as: PROVENTIL Take by nebulization.   Ventolin HFA 108 (90 Base) MCG/ACT inhaler Generic drug: albuterol Inhale 2 puffs into the lungs every 4 (four) hours as needed.   amLODipine 5 MG tablet Commonly known as: NORVASC Take 5 mg by mouth daily.   isosorbide dinitrate 20 MG tablet Commonly known as: ISORDIL Take 20 mg by mouth daily.   methocarbamol 500 MG tablet Commonly known as: ROBAXIN Take 500 mg by mouth daily.   metoprolol  tartrate 25 MG tablet Commonly known as: LOPRESSOR  Take 25 mg by mouth 2 (two) times daily.   mirtazapine 30 MG tablet Commonly known as: REMERON Take 30 mg by mouth at bedtime.   nystatin  ointment Commonly known as: MYCOSTATIN  Apply 1 application. topically 2 (two) times daily.   Spiriva HandiHaler 18 MCG inhalation capsule Generic drug: tiotropium 1 capsule daily.   triamcinolone cream 0.1  % Commonly known as: KENALOG Apply topically.        Allergies:  Allergies  Allergen Reactions   Amoxapine And Related Nausea Only    Family History: Family History  Problem Relation Age of Onset   Heart failure Mother     Social History:  reports that he has quit smoking. He does not have any smokeless tobacco history on file. He reports that he does not drink alcohol and does not use drugs.  ROS: All other review of systems were reviewed and are negative except what is noted above in HPI  Physical Exam: BP 123/73   Pulse 62   Constitutional:  Alert and oriented, No acute distress. HEENT: [place] AT, moist mucus membranes.  Trachea midline, no masses. Cardiovascular: No clubbing, cyanosis, or edema. Respiratory: Normal respiratory effort, no increased work of breathing. GI: Abdomen is soft, nontender, nondistended, no abdominal masses GU: No CVA tenderness.  Lymph: No cervical or inguinal lymphadenopathy. Skin: No rashes, bruises or suspicious lesions. Neurologic: Grossly intact, no focal deficits, moving all 4 extremities. Psychiatric: Normal mood and affect.  Laboratory Data: Lab Results  Component Value Date   WBC 7.7 02/12/2012   HGB 14.3 02/12/2012   HCT 41.6 02/12/2012   MCV 90.0 02/12/2012   PLT 241 02/12/2012    Lab Results  Component Value Date   CREATININE 0.90 02/12/2012    No results found for: PSA  No results found for: TESTOSTERONE  No results found for: HGBA1C  Urinalysis    Component Value Date/Time   COLORURINE YELLOW 10/09/2011 1240   APPEARANCEUR Clear 02/20/2022 1119   LABSPEC 1.015 10/09/2011 1240   PHURINE 6.0 10/09/2011 1240   GLUCOSEU Negative 02/20/2022 1119   HGBUR NEGATIVE 10/09/2011 1240   BILIRUBINUR Negative 02/20/2022 1119   KETONESUR NEGATIVE 10/09/2011 1240   PROTEINUR Negative 02/20/2022 1119   PROTEINUR NEGATIVE 10/09/2011 1240   UROBILINOGEN 0.2 10/09/2011 1240   NITRITE Negative 02/20/2022 1119   NITRITE  NEGATIVE 10/09/2011 1240   LEUKOCYTESUR Negative 02/20/2022 1119    Lab Results  Component Value Date   LABMICR Comment 02/20/2022    Pertinent Imaging:  No results found for this or any previous visit.  No results found for this or any previous visit.  No results found for this or any previous visit.  No results found for this or any previous visit.  No results found for this or any previous visit.  No results found for this or any previous visit.  No results found for this or any previous visit.  No results found for this or any previous visit.   Assessment & Plan:    1. Bladder stone (Primary) The risks/benefits/alterantives to cystolithalopaxy was explained tot he patient and he understands and wishes to proceed with surgery - Urinalysis, Routine w reflex microscopic  2. Nephrolithiasis -We discussed the management of kidney stones. These options include observation, ureteroscopy, shockwave lithotripsy (ESWL) and percutaneous nephrolithotomy (PCNL). We discussed which options are relevant to the patient's stone(s). We discussed the natural history of kidney stones as well as the complications of untreated stones and the impact on quality of life without treatment as well as with each of the above listed treatments. We also discussed the efficacy of each treatment in its ability to clear the stone burden. With any of these management options I discussed the signs and symptoms of infection and the need for emergent treatment should these be experienced. For each option we discussed the ability of each procedure to clear the patient of their stone burden.   For observation I described the risks which include but are not limited to silent renal damage, life-threatening infection, need for emergent surgery, failure to pass stone and pain.   For ureteroscopy I described the risks which include bleeding, infection, damage to contiguous structures, positioning injury, ureteral  stricture, ureteral avulsion, ureteral injury, need for prolonged ureteral stent, inability to perform ureteroscopy, need for an interval procedure, inability to clear stone burden, stent discomfort/pain, heart attack, stroke, pulmonary embolus and the inherent risks with general anesthesia.   For shockwave lithotripsy I described the risks which include arrhythmia, kidney contusion, kidney hemorrhage, need for transfusion, pain, inability to adequately break up stone, inability to pass stone fragments, Steinstrasse, infection associated with obstructing stones, need for alternate surgical procedure, need for repeat shockwave lithotripsy, MI, CVA, PE and the inherent risks with anesthesia/conscious sedation.   For PCNL I described the risks including positioning injury, pneumothorax, hydrothorax, need for chest tube, inability to clear stone burden, renal laceration, arterial venous fistula or malformation, need for embolization of kidney, loss of kidney or renal function, need for repeat procedure, need for prolonged nephrostomy tube, ureteral avulsion, MI, CVA, PE and the inherent risks of general anesthesia.   - The patient would like to proceed with observation  No follow-ups on file.  Belvie Clara, MD  [facility] [place]

## 2024-08-26 NOTE — Patient Instructions (Signed)
Bladder Stone  A bladder stone is a buildup of crystals made from the proteins and minerals found in urine. These substances build up when urine becomes too concentrated. Urine is concentrated when there is less water and more proteins and minerals in it. Bladder stones usually develop when a person has another medical condition that prevents the bladder from emptying completely. Crystals can form in the small amount of urine that is left in the bladder. Bladder stones that grow large can become painful and may block the flow of urine. What are the causes? This condition may be caused by: An enlarged prostate, which prevents the bladder from emptying well. An infection of a part of your urinary system (urinary tract infection, or UTI). This includes the: Kidneys. Bladder. Ureters. These are the tubes that carry urine to your bladder. Urethra. This is the tube that drains urine from your bladder. A weak spot in the bladder that creates a small pouch (bladder diverticulum). Nerve damage that may interfere with the signals from your brain to your bladder muscles (neurogenic bladder). This can result from conditions such as Parkinson's disease or spinal cord injuries. What increases the risk? This condition is more likely to develop in people who: Get frequent UTIs. Have another medical condition that affects the bladder. Have a history of bladder surgery. Have a spinal cord injury. Have an abnormal shape of the bladder (deformity). What are the signs or symptoms? Common symptoms of this condition include: Pain in the abdomen. A need to urinate more often. Difficulty or pain when urinating. Blood in the urine. Cloudy urine or urine that is dark in color. Pain in the penis or testicles in men. Small bladder stones do not always cause symptoms. How is this diagnosed? This condition may be diagnosed based on your symptoms, medical history, and physical exam. The physical exam will check for  tenderness in your abdomen. For men, an exam in the rectum may be done to check the prostate gland. You may have tests, such as: A urine test (urinalysis). A urine sample test to check for other infections (culture). Blood tests, including tests to look for a certain substance (creatinine). A creatinine level that is higher than normal could indicate a blockage. A procedure to check your bladder using a scope with a camera (cystoscopy). You may also have imaging studies, such as: CT scan or ultrasound of your abdomen and the area between your hip bones (pelvis or pelvic area). An X-ray of your urinary system. How is this treated? This condition may be treated with: Cystolitholapaxy. This procedure uses a laser, ultrasound, or other device to break the stone into smaller pieces. Fluids are used to flush the small pieces from the area. Surgery to remove the stone. A stent. This is a small mesh tube that is threaded into your ureter to make urine flow. Medicines to treat pain. Follow these instructions at home: Medicines Take over-the-counter and prescription medicines only as told by your health care provider. Ask your health care provider if the medicine prescribed to you: Requires you to avoid driving or using heavy machinery. Can cause constipation. You may need to take these actions to prevent or treat constipation: Take over-the-counter or prescription medicines. Eat foods that are high in fiber, such as beans, whole grains, and fresh fruits and vegetables. Limit foods that are high in fat and processed sugars, such as fried or sweet foods. Alcohol use Do not drink alcohol if: Your health care provider tells you not to  drink. You are pregnant, may be pregnant, or are planning to become pregnant. If you drink alcohol: Limit how much you drink to: 0-1 drink a day for women. 0-2 drinks a day for men. Be aware of how much alcohol is in your drink. In the [place] one drink equals one 12  oz bottle of beer (355 mL), one 5 oz glass of wine (148 mL), or one 1 oz glass of hard liquor (44 mL). Activity Rest as told by your health care provider. Return to your normal activities as told by your health care provider. Ask your health care provider what activities are safe for you. General instructions  Drink enough fluid to keep your urine pale yellow. Tell your health care provider about any unusual symptoms related to urinating. Early diagnosis of an enlarged prostate and other bladder conditions may reduce your risk of getting bladder stones. Do not use any products that contain nicotine or tobacco, such as cigarettes, e-cigarettes, or chewing tobacco. If you need help quitting, ask your health care provider. Do not use drugs. Where to find more information [facility]): www.[facility].org Contact a health care provider if you: Have a fever. Feel nauseous or vomit. Are unable to urinate. Have a large amount of blood in your urine. Get help right away if you: Have severe back pain or pain in the lower part of your abdomen. Cannot eat or drink without vomiting. Vomit after taking your medicine. Summary A bladder stone is a buildup of crystals made from the proteins and minerals found in urine. These substances build up when urine becomes too concentrated. Bladder stones that grow large can become painful and may block the flow of urine. Bladder stones may be treated with a laser, a stent, surgery, or pain medicines. This information is not intended to replace advice given to you by your health care provider. Make sure you discuss any questions you have with your health care provider. Document Revised: 10/13/2022 Document Reviewed: 10/13/2022 Elsevier Patient Education  2024 [employer].

## 2024-09-01 DIAGNOSIS — I5023 Acute on chronic systolic (congestive) heart failure: Secondary | ICD-10-CM | POA: Diagnosis not present

## 2024-09-01 DIAGNOSIS — Z7901 Long term (current) use of anticoagulants: Secondary | ICD-10-CM | POA: Diagnosis not present

## 2024-09-01 DIAGNOSIS — Z791 Long term (current) use of non-steroidal anti-inflammatories (NSAID): Secondary | ICD-10-CM | POA: Diagnosis not present

## 2024-09-01 DIAGNOSIS — Z792 Long term (current) use of antibiotics: Secondary | ICD-10-CM | POA: Diagnosis not present

## 2024-09-01 DIAGNOSIS — Z9981 Dependence on supplemental oxygen: Secondary | ICD-10-CM | POA: Diagnosis not present

## 2024-09-01 DIAGNOSIS — J432 Centrilobular emphysema: Secondary | ICD-10-CM | POA: Diagnosis not present

## 2024-09-01 DIAGNOSIS — I2510 Atherosclerotic heart disease of native coronary artery without angina pectoris: Secondary | ICD-10-CM | POA: Diagnosis not present

## 2024-09-01 DIAGNOSIS — I110 Hypertensive heart disease with heart failure: Secondary | ICD-10-CM | POA: Diagnosis not present

## 2024-09-01 DIAGNOSIS — J441 Chronic obstructive pulmonary disease with (acute) exacerbation: Secondary | ICD-10-CM | POA: Diagnosis not present

## 2024-09-01 DIAGNOSIS — F17210 Nicotine dependence, cigarettes, uncomplicated: Secondary | ICD-10-CM | POA: Diagnosis not present

## 2024-09-01 DIAGNOSIS — Z7982 Long term (current) use of aspirin: Secondary | ICD-10-CM | POA: Diagnosis not present

## 2024-09-01 DIAGNOSIS — R609 Edema, unspecified: Secondary | ICD-10-CM | POA: Diagnosis not present

## 2024-09-01 DIAGNOSIS — R0689 Other abnormalities of breathing: Secondary | ICD-10-CM | POA: Diagnosis not present

## 2024-09-01 DIAGNOSIS — Z7951 Long term (current) use of inhaled steroids: Secondary | ICD-10-CM | POA: Diagnosis not present

## 2024-09-01 DIAGNOSIS — R0902 Hypoxemia: Secondary | ICD-10-CM | POA: Diagnosis not present

## 2024-09-01 DIAGNOSIS — J9601 Acute respiratory failure with hypoxia: Secondary | ICD-10-CM | POA: Diagnosis not present

## 2024-09-01 DIAGNOSIS — B965 Pseudomonas (aeruginosa) (mallei) (pseudomallei) as the cause of diseases classified elsewhere: Secondary | ICD-10-CM | POA: Diagnosis not present

## 2024-09-01 DIAGNOSIS — J449 Chronic obstructive pulmonary disease, unspecified: Secondary | ICD-10-CM | POA: Diagnosis not present

## 2024-09-01 DIAGNOSIS — I517 Cardiomegaly: Secondary | ICD-10-CM | POA: Diagnosis not present

## 2024-09-01 DIAGNOSIS — I509 Heart failure, unspecified: Secondary | ICD-10-CM | POA: Diagnosis not present

## 2024-09-01 DIAGNOSIS — R062 Wheezing: Secondary | ICD-10-CM | POA: Diagnosis not present

## 2024-09-01 DIAGNOSIS — J9690 Respiratory failure, unspecified, unspecified whether with hypoxia or hypercapnia: Secondary | ICD-10-CM | POA: Diagnosis not present

## 2024-09-01 DIAGNOSIS — I4891 Unspecified atrial fibrillation: Secondary | ICD-10-CM | POA: Diagnosis not present

## 2024-09-01 DIAGNOSIS — I4892 Unspecified atrial flutter: Secondary | ICD-10-CM | POA: Diagnosis not present

## 2024-09-01 DIAGNOSIS — Z7984 Long term (current) use of oral hypoglycemic drugs: Secondary | ICD-10-CM | POA: Diagnosis not present

## 2024-09-01 DIAGNOSIS — N399 Disorder of urinary system, unspecified: Secondary | ICD-10-CM | POA: Diagnosis not present

## 2024-09-01 DIAGNOSIS — N390 Urinary tract infection, site not specified: Secondary | ICD-10-CM | POA: Diagnosis not present

## 2024-09-01 DIAGNOSIS — R0602 Shortness of breath: Secondary | ICD-10-CM | POA: Diagnosis not present

## 2024-09-01 DIAGNOSIS — E8770 Fluid overload, unspecified: Secondary | ICD-10-CM | POA: Diagnosis not present

## 2024-09-01 DIAGNOSIS — I5033 Acute on chronic diastolic (congestive) heart failure: Secondary | ICD-10-CM | POA: Diagnosis not present

## 2024-09-01 DIAGNOSIS — I2489 Other forms of acute ischemic heart disease: Secondary | ICD-10-CM | POA: Diagnosis not present

## 2024-09-01 DIAGNOSIS — R7989 Other specified abnormal findings of blood chemistry: Secondary | ICD-10-CM | POA: Diagnosis not present

## 2024-09-01 DIAGNOSIS — Z794 Long term (current) use of insulin: Secondary | ICD-10-CM | POA: Diagnosis not present

## 2024-09-01 DIAGNOSIS — Z79899 Other long term (current) drug therapy: Secondary | ICD-10-CM | POA: Diagnosis not present

## 2024-09-01 DIAGNOSIS — I5020 Unspecified systolic (congestive) heart failure: Secondary | ICD-10-CM | POA: Diagnosis not present

## 2024-09-01 DIAGNOSIS — R9389 Abnormal findings on diagnostic imaging of other specified body structures: Secondary | ICD-10-CM | POA: Diagnosis not present

## 2024-09-05 NOTE — Progress Notes (Signed)
 Cardiology Progress Note  67M PMHx HTN, COPD, OA p/w SOB and leg swelling found with UTI, new AFL, ADHF.   #Atrial Flutter: Most recent EKG 10/10 shows HR 120's in AFL with variable block, new diagnosis, may be triggered in setting of infection, most recent HR 80's, AC is indicated for CHADSVASc 3 - TSH was 0.8 on 09/02/2024 - Continue apixaban 5mg  q12 - Metoprolol  tartrate 25mg  q12 started Friday.  Decrease to 12.5 mg p.o. twice daily - Patient having breakthrough episodes of A-fib RVR over the weekend per hospitalist service - Start diltiazem 30 mg 4 times daily for breakthrough A-fib episodes with parameters - Continue to monitor  telemetry while inpatient. - Place ZIO monitor for 14 days to assess AF/AFL burden - If patient remains in AFL, he would benefit from cardioversion as outpatient - On telemetry review today he is in normal sinus rhythm with heart rate of 62. - Please refer to [facility] in [facility]   #Elevated Troponin #CAD Trop 65 -> 62 -> 53 likely represents demand ischemia in setting of AFL w/ RVR and ADHF though CTA chest (for PE) did how mild 3v coronary atherosclerosis - lipoprotein(a), in process.  A1c 5.6%, EAG 114 - Lipids 09/02/2024; TC 123, TG 55, HDL 40, LDL 75 - TTE demonstrates EF greater than 55%, severely increased wall thickness, RV severely dilated with normal function, RA mildly dilated, IVC size expiratory changes suggest right atrial pressure 10 to 20 mmHg - Atorvastatin 40mg  daily - No need for daily aspirin  since pt is being started on apixaban   #CHF:  BNP 11976, evidence of volume overload on exam, acute decompensation may be occurring in setting of new AFL - See report below.  EF greater than 55%, severely increased LV wall thickness, RV severely dilated, RAP 10 to 20 mmHg - Lasix IV 40mg  q12 -Current cumulative UOP -9.6 L so far.  Denies any breathing difficulty.  No overt signs symptoms of current ADHF on exam this morning.  Appears euvolemic. -  When BP is stable, start spironolactone 25mg  daily, valsartan 80mg  daily -  TTE demonstrates EF greater than 55%, severely increased wall thickness, RV severely dilated with normal function, RA mildly dilated, IVC size expiratory changes suggest right atrial pressure 10 to 20 mmHg -Start Jardiance 10 mg p.o. daily today will advance GDMT based on trending blood pressures     Subjective:  Interval History: Per hospitalist service patient has been having episodes of breakthrough atrial fibrillation with RVR causing him some distress stating he becomes significantly dizzy when this occurs.  However when speaking to the patient he states he has been sleeping most of the week and endorsed becomes somewhat dizzy when getting up to go to the bathroom but feels no palpitations.  He did state prior to having presenting he had a couple of episodes the prior weeks leading up to presentation of feeling as though he may pass out and at 1 point almost completely passed out per his statement.  He states this was new for him.  He admits to a long history of smoking at least 50+ years with current smoking.  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  He states his primary care provider has had him see pulmonology in the past and states his lungs are okay per his statement today.  We discussed starting low-dose Cardizem 30 mg p.o. 4 times daily to help with intermittent breakthrough episodes of atrial fibrillation/palpitations.  He verbalizes understanding.  We discussed need to be on anticoagulation for CVA prophylaxis.  He verbalizes understanding.  He will need to follow-up with [facility] cardiology postdischarge for follow-up and to establish and review of ZIO monitor  Objective:  Vital signs in last 24 hours: Temp:  [36.4 C (97.5 F)-36.9 C (98.4 F)] 36.9 C (98.4 F) Pulse:  [52-135] 62 Resp:  [15-19] 17 BP: (100-129)/(74-88) 100/74 MAP (mmHg):  [82-97] 82 FiO2  (%):  [28 %] 28 % SpO2:  [92 %-97 %] 96 %  Intake/Output last 3 shifts: I/O last 3 completed shifts: In: 1030 [P.O.:1030] Out: 3100 [Urine:3100]  Physical Exam: Physical Exam

## 2024-09-07 NOTE — Discharge Summary (Signed)
 DISCHARGE SUMMARY [facility]   Discharge date:   September 07, 2024 Length of stay:    LOS: 5 days    Discharge Service:   [facility] Discharge Attending Physician: Brutus Franco Shank, FNP Discharge to:    To Home Condition at Discharge:  good Code status:                         Full Code   Hospital Course: 09/02/24 Admitted for acute hypoxic respiratory failure, volume overload, dyspnea requiring oxygen support.  Peripheral edema.  IV diuresis, cardiology consult, echo. Also atrial flutter, elevated trops, started on Eliquis.  IV antibx for UTI.  09/03/24 Saturating low 90s on 3L.  Diuresing well, net negative 3.1L on 10/10.  HR normalized to SR 60s.  09/04/24 HR up to 130s, dizzy.  EKG.  BP 82/58.  Received metoprolol  this morning.    09/05/24 Pseudomonas in sputum, klebsiella in urine.  Switch to Zosyn.  ______________________________________   Admission HPI    Patient admitted on: 09/01/2024  7:46 PM  Patient admitted by: Darin Shelvy Lonni Verdie, MD    CHIEF COMPLAINT: Shortness of breath   Day of admission HPI:  Anthony Hobbs  is a 68 y.o. male with a PMH significant for osteoarthritis, COPD, HTN, history of kidney stones presenting to the ED with and acute on chronic progressively worsening shortness of breath.  Patient reports he has been having increased swelling and discomfort in his legs bilaterally and this has been affecting his ambulatory capacity and which he associates with 2-pillow orthopnea and progressively worsening shortness of breath.  He denies any diaphoresis,/jaw pain or syncopal events. He reports that he has become very quickly dyspneic with any activity or extended conversation.  He denies any fever, chills, chest pain, nausea vomiting at the time of my assessment.  Workup is also indicative of a UTI and he does report urine frequency but remains equivocal about dysuria or acute low back pain.   Workup in the ED is significant for  marked elevation in proBNP of 11,976, 65 => 62 => 53, respiratory acidosis with a pH of 7.34, pCO2: 51 and UTI.  CT of the chest with contrast is negative for PE and consolidation, there is mild centrilobular emphysema concern for [facility]    Patient admitted on Home O2? - yes Patient on home anticoagulant? -  no Patient admitted with Chronic home foley catheter? - no Foley catheter placed or replaced by another service prior to admission? - no Central Line Status: NONE   Mental Status on Admission: The patient is Alert and oriented to PERSON The patient is Alert And oriented to TIME The patient is Alert and oriented to LOCATION   Today; patient is sitting up in bed awake and alert.  He is ready to go home.  [facility].     Problem List, Assessment & Plan     ASSESSMENT & PLAN (In order of descending acuity)   Respiratory insufficiency, volume overload, CHF with preserved EF acute on chronic BNP 11,976, peripheral edema and signs of volume overload with crackles, hypoxia; repeat BNP 10/11 2733 Has been receiving BID IV diuresis, resumed home Lasix today p.o. Strict intake and output, fluid restriction 1.8 L; net negative 795 mL on 10/14 CT of the chest shows no PE or consolidation, emphysema and pulmonary arterial hypertension present Echocardiogram shows EF 55% with severe dilation of right ventricle, we appreciate cardiology  consult Lower respiratory culture repeated, shows pseudomonas; repeat chest x ray 10/14 Work of breathing initially was increased with tachypnea, has normalized Will follow-up with cardiology outpatient   Pneumonia Did not show on original imaging, will do repeat chest x ray 2 view 10/14 Started Zosyn 10/13 Sputum culture shows pseudomonas Discharged home with Cipro   Atrial flutter, resolved Rate up to 140s to 150s at times initially, now 60s, sinus rhythm  Episodes of tachycardia have seemingly stopped, to continue Metoprolol  and  cardiology added Cardizem 10/13, responding well Prescriptions for new [facility]; metoprolol  dosage changed to 12.5 mg twice daily and Lasix dosage changed to 40 mg daily Eliquis for anticoagulation, [facility] Echo shows normal EF Continue to monitor with telemetry Will need zio patch at discharge   Elevated troponins Troponin 65, 62, 53 No chest pain Echocardiogram 10/10 shows EF > 55% with severe dilation of right ventricle, elevated right atrial pressure   UTI Zosyn, urine culture shows Klebsiella Sent home on Cipro   COPD Continue Brovana therapy, steroids, titrate oxygen between 88 and 92% Requiring 2L today consistently Oxygen testing 10/14, patient qualified for 2 L to wear at home Instructed patient to wear 2 L of oxygen 24 hours a day until he is able to follow-up with his PCP in 1 to 2 weeks   HTN Continue isosorbide, holding valsartan currently due to soft blood pressure, resumed spironolactone 12.5mg  daily and monitor BP Discontinue valsartan at discharge   Anticoagulation; Eliquis Oxygen; 2 L IV fluids; none IV antibiotics; Zosyn day 3; Cipro at discharge   Patient is doing much better and is ready to discharge home.  Discharge plan discussed with the patient at length as well as with nursing to emphasize teaching points for home.  Will follow-up with cardiology outpatient.  Discharged with [facility]io patch and [facility] [place]  Discharge home.   ADDITIONAL NON-ACUTE FINDINGS, OBSERVATIONS, FAMILY DISCUSSIONS, ETC. (When present):   General; ill-appearing 68 year old male Cardiovascular; regular rhythm, rate in the 60s Pulmonary; lungs clear but coarse, no wheezing; requiring 2L of oxygen, normal breathing effort Abdomen; soft, nontender, bowel sounds present all 4 quadrants Extremities; marked edema bilaterally but improved to 1-2+, moves all extremities spontaneously Neuro; alert and oriented x 3, no focal  deficit Psych; calm    Incidental Findings for outpatient Follow-Up: No significant incidental findings present    DVT Prophylaxis Ordered: SQ Eliquis  Addendum: 09/08/2024 => patient found wandering on the second floor wearing his portable oxygen and looking to speak with somebody.  He was discharged yesterday.  [facility] [place] [facility] because the directions state take 40 mg every morning and take 20 mg every morning.  I reviewed the prescription and adjusted to reflect the desired dose of take 40 mg every morning.  He will be given 90-day supply.  I tried calling the pharmacy on 4 different occasions but I am unable to speak with any human person at [facility].  It only allows me to leave voicemail and they said that the pharmacy team will call me back within 1 hour.  Therefore, I corrected the prescription and send it in.  It now says the following furosemide 40 mg take 1 p.o. every morning dispense 90-day supply with no refill ______________________________________  Mental Status On day of Discharge:  The patient is Alert and oriented to PERSON The patient is Alert And oriented to TIME The patient  is Alert and oriented to LOCATION  CODE STATUS :                    Full Code   An advanced care planning discussion was  had with patient and/or patient's decisions maker (documented separately).  Patient discharged on Home O2? - yes Patient discharged on home anticoagulant? -  yes  Foley Catheter status: None Central Line Status: NONE  Time Spent on Discharge I spent greater than 30 minutes counseling and coordinating care for the discharge of this patient. The pt and I discussed the importance of outpatient follow-up as well as concerning signs and symptoms that would require immediate evaluation by a medical professional. The aforementioned conversation participants understand  and did show insight. I did use teachback to ensure  understanding. The above participant/s is aware that not following the discussed plan, recommendations, and follow up can lead to severe negative effects on the patient's health, up to and including death.  Discharge Medications     Your Medication List     STOP taking these medications    amlodipine 10 MG tablet Commonly known as: NORVASC   aspirin -acetaminophen  500-325 mg Pack   methocarbamol 500 MG tablet Commonly known as: ROBAXIN   naproxen 500 MG tablet Commonly known as: NAPROSYN   olmesartan 40 MG tablet Commonly known as: BENICAR       START taking these medications    albuterol 90 mcg/actuation inhaler Commonly known as: PROVENTIL HFA;VENTOLIN HFA Inhale 2 puffs every four (4) hours as needed for wheezing or shortness of breath.   apixaban 5 mg Tab Commonly known as: ELIQUIS Take 1 tablet (5 mg total) by mouth two (2) times a day.   ciprofloxacin HCl 500 MG tablet Commonly known as: CIPRO Take 1 tablet (500 mg total) by mouth two (2) times a day for 5 days.   dilTIAZem 120 MG 24 hr capsule Commonly known as: CARDIZEM CD Take 1 capsule (120 mg total) by mouth daily.   empagliflozin 10 mg tablet Commonly known as: JARDIANCE Take 1 tablet (10 mg total) by mouth daily.   ipratropium-albuterol 0.5-2.5 mg/3 mL nebulizer Commonly known as: DUO-NEB Inhale 3 mL by nebulization every eight (8) hours as needed.   predniSONE 20 MG tablet Commonly known as: DELTASONE Take 2 tablets (40 mg total) by mouth daily for 3 days, THEN 1 tablet (20 mg total) daily for 4 days, THEN 0.5 tablets (10 mg total) daily for 4 days. Start taking on: September 07, 2024       CHANGE how you take these medications    furosemide 20 MG tablet Commonly known as: LASIX Take 2 tablets (40 mg total) by mouth every morning. TAKE 20 MG BY MOUTH EVERY MORNING. What changed: how much to take   metoPROLOL  tartrate 25 MG tablet Commonly known as: Lopressor  Take 0.5 tablets (12.5 mg  total) by mouth two (2) times a day. What changed: how much to take   spironolactone 25 MG tablet Commonly known as: ALDACTONE Take 0.5 tablets (12.5 mg total) by mouth two (2) times a day. What changed: how much to take       CONTINUE taking these medications    atorvastatin 40 MG tablet Commonly known as: LIPITOR Take 1 tablet (40 mg total) by mouth in the morning.   isosorbide dinitrate 20 MG tablet Commonly known as: ISORDIL Take 1 tablet (20 mg total) by mouth in the morning.   SYMBICORT 160-4.5 mcg/actuation inhaler Generic  drug: budesonide-formoterol Inhale 2 puffs in the morning and 2 puffs before bedtime.   tamsulosin 0.4 mg capsule Commonly known as: FLOMAX Take 1 capsule (0.4 mg total) by mouth daily.       _____________________________________  Nutrition:                                  Diet Instructions     Discharge diet (specify)     Discharge Nutrition Therapy: Heart Healthy   Limit fluid intake to 2L or less each day!                        ___________________________________________  Discharge Instructions   Nutrition:                                  Diet Instructions     Discharge diet (specify)     Discharge Nutrition Therapy: Heart Healthy   Limit fluid intake to 2L or less each day!          Activity:                                   Activity Instructions     Activity as tolerated         Appointments:                         Appointments which have been scheduled for you    Sep 22, 2024 10:40 AM (Arrive by 10:10 AM) HOSPITAL FOLLOW UP CARDIOLOGY with [facility] [place]) [address] [place] [id number]         Follow Up:                              Follow Up instructions and Outpatient Referrals    Ambulatory Referral to Cardiology     Reason for referral: hospital follow up   Specific Service Requested: General  Cardiology   Requested follow up plan: You would evaluate and manage.   Ambulatory Referral to Palliative Care     Reason for referral: palliative   Is this a palliative referral for:  symptom management goals of care coping support     Requested follow up plan: You would evaluate and manage.   Call MD for:  persistent nausea or vomiting     Call MD for:  severe uncontrolled pain     Call MD for: Temperature > 38.5 Celsius ( > 101.3 Fahrenheit)     Discharge instructions     Ambulatory Referral to Cardiology     Reason for referral: New onset atrial fibrillation RVR, CHF   Specific Service Requested: General Cardiology       Allergies  Allergen Reactions  . Amoxapine Nausea Only  . Amoxapine Analogues (Dibenzoxazepines) Nausea Only     Past Medical History[1]  Past Surgical History[2]   Family History[3]   Current Medications[4]  Imaging  XR Chest 2 views Result Date: 09/06/2024 Exam:  Chest Two Views  History:  Hypoxia  Technique:  2 views  Comparison:  September 02, 2024. October 01, 2020.  Findings:  Extensive old  posttraumatic deformities of multiple bilateral ribs again artifactually overlie each thoracic region with extensive callus formation associated.   Lungs appear clear as visualized with no acute infiltrate identified. No pleural effusions or CHF are evident. Left atrial appendage enlargement again noted with cardiac dimension otherwise stable, normal. Right hemidiaphragm elevation as previously documented, again obscuring the right posterior lung base.    Chronic findings as above with no superimposed acute infiltrate seen    Signed (Electronic Signature): 09/06/2024 9:25 AM Signed By: Dow JAYSON Agee, MD  ECG 12 Lead Result Date: 09/04/2024 Normal sinus rhythm Right superior axis deviation Pulmonary disease pattern Right ventricular hypertrophy T wave abnormality, consider anterolateral ischemia Abnormal ECG When compared with ECG of 02-Sep-2024 01:51,  Significant changes have occurred  ECG 12 Lead Result Date: 09/03/2024 Atrial flutter with variable AV block Right superior axis deviation Pulmonary disease pattern Right ventricular hypertrophy Nonspecific T wave abnormality Abnormal ECG When compared with ECG of 01-Sep-2024 22:01, ST now depressed in Inferior leads Nonspecific T wave abnormality no longer evident in Inferior leads  ECG 12 Lead Result Date: 09/03/2024 Atrial flutter with variable AV block Right superior axis deviation Pulmonary disease pattern Right ventricular hypertrophy Nonspecific T wave abnormality Abnormal ECG When compared with ECG of 01-Sep-2024 19:46, Atrial flutter has replaced Sinus rhythm ST no longer depressed in Inferior leads  ECG 12 Lead Result Date: 09/03/2024 Sinus tachycardia Right superior axis deviation Right ventricular hypertrophy with repolarization abnormality Nonspecific T wave abnormality Abnormal ECG When compared with ECG of 31-Mar-2021 10:34, Significant changes have occurred  Echocardiogram W Colorflow Spectral Doppler Result Date: 09/02/2024 Patient Info Name:     Anthony Hobbs Age:     68 years DOB:     10-24-1956 Gender:     Male MRN:     [id number] Accession #:     [id number] [facility] Account #:     [id number] Ht:     183 cm Wt:     84 kg BSA:     2.07 m2 BP:     95 /     67 mmHg HR:     98 bpm Exam Date:     09/02/2024 1:52 PM Admit Date:     09/01/2024 Exam Type:     ECHOCARDIOGRAM W COLORFLOW SPECTRAL DOPPLER Technical Quality:     Fair Staff Sonographer:     Mliss Gate Supervising Physician:     Earla Maude Currier MD Ordering Physician:     Brutus Bryant Pace Study Info Indications      - CHF,  dyspnea Procedure(s)   Complete two-dimensional, color flow and Doppler transthoracic echocardiogram is performed. Summary   1. The left ventricle is normal in size with severely increased wall thickness.   2. The left ventricular systolic function is normal, LVEF is visually estimated at > 55%.   3.  The right ventricle is severely dilated in size, with normal systolic function.   4. The right atrium is mildly dilated in size.   5. IVC size and inspiratory change suggest elevated right atrial pressure. (10-20 mmHg). Left Ventricle   The left ventricle is normal in size with severely increased wall thickness. The left ventricular systolic function is normal, LVEF is visually estimated at > 55%. Left ventricular diastolic function cannot be accurately assessed. Right Ventricle   The right ventricle is severely dilated in size, with normal systolic function. Left Atrium   The left atrium is normal in size. Right Atrium   The right atrium is mildly  dilated in size. Aortic Valve   The aortic valve is trileaflet with normal appearing leaflets with normal excursion. Calcified nodule on non coronary cusp measuring 8 x 6 mm. There is no significant aortic regurgitation. There is no evidence of a significant transvalvular gradient. Mitral Valve   The mitral valve leaflets are normal with normal leaflet mobility. There is no significant mitral valve regurgitation. Tricuspid Valve   The tricuspid valve leaflets are normal, with normal leaflet mobility. There is no significant tricuspid regurgitation. Pulmonic Valve   The pulmonic valve is poorly visualized. There is trivial pulmonic regurgitation. Inferior Vena Cava   IVC size and inspiratory change suggest elevated right atrial pressure. (10-20 mmHg). Pericardium/Pleural   There is no pericardial effusion. Ventricles ---------------------------------------------------------------------- Name                                 Value        Normal ---------------------------------------------------------------------- LV Dimensions 2D/MM ----------------------------------------------------------------------  IVS Diastolic Thickness (2D)                                1.8 cm       0.6-1.0 LVID Diastole (2D)                  4.5 cm       4.2-5.8  LVPW Diastolic Thickness (2D)                                 1.4 cm       0.6-1.0 LVID Systole (2D)                   2.7 cm       2.5-4.0 LVOT Diameter                       1.7 cm               LV Mass Index (2D Cubed)          147 g/m2        49-115  Relative Wall Thickness (2D)                                  0.62        <=0.42 LV Function ---------------------------------------------------------------------- LV EF (4C MOD)                        55 %                LV Diastolic Volume Index (BP MOD)                        37.6 ml/m2     34.0-74.0 RV Dimensions 2D/MM ----------------------------------------------------------------------  RV Basal Diastolic Dimension                           5.2 cm       2.5-4.1 TAPSE                               1.9 cm         >=  1.7 Atria ---------------------------------------------------------------------- Name                                 Value        Normal ---------------------------------------------------------------------- LA Dimensions ---------------------------------------------------------------------- LA Dimension (2D)                   3.8 cm       3.0-4.1 LA Volume Index (4C A-L)        21.34 ml/m2               LA Volume Index (2C A-L)        19.93 ml/m2               LA Volume (BP MOD)                   43 ml               LA Volume Index (BP MOD)        20.75 ml/m2   16.00-34.00 RA Dimensions ---------------------------------------------------------------------- RA Area (4C)                      23.2 cm2        <=18.0 RA Area (4C) Index              11.2 cm2/m2               RA ESV Index (4C MOD)             36 ml/m2         18-32 Left Ventricular Outflow Tract ---------------------------------------------------------------------- Name                                 Value        Normal ---------------------------------------------------------------------- LVOT 2D ---------------------------------------------------------------------- LVOT Diameter                       1.7 cm                LVOT Area                          2.3 cm2               LVOT Doppler ---------------------------------------------------------------------- LVOT Peak Velocity                 0.8 m/s               LVOT VTI                             12 cm               LVOT Stroke Volume                   26 ml               LVOT SI                           13 ml/m2 Aortic Valve ---------------------------------------------------------------------- Name  Value        Normal ---------------------------------------------------------------------- AV Doppler ---------------------------------------------------------------------- AV Peak Velocity                   1.2 m/s               AV Peak Gradient                    6 mmHg               AV Mean Gradient                    3 mmHg               AV VTI                               17 cm               AV Area (Cont Eq VTI)              1.5 cm2         >=3.0 AV Area Index (Cont Eq VTI)     0.7 cm2/m2               AV Area (Cont Eq Vel)              1.5 cm2               AV Area Index (Cont Eq Vel)     0.7 cm2/m2               AV DI (Vel)                           0.65               AV DI (VTI)                           0.67 Mitral Valve ---------------------------------------------------------------------- Name                                 Value        Normal ---------------------------------------------------------------------- MV Annular TDI ---------------------------------------------------------------------- MV Septal e' Velocity             5.6 cm/s         >=8.0 MV Lateral e' Velocity           14.9 cm/s        >=10.0 MV e' Average                    10.2 cm/s Tricuspid Valve ---------------------------------------------------------------------- Name                                 Value        Normal ---------------------------------------------------------------------- TV Regurgitation Doppler  ---------------------------------------------------------------------- TR Peak Velocity                   2.5 m/s               Estimated PAP/RSVP ---------------------------------------------------------------------- RA Pressure  15 mmHg           <=5 RV Systolic Pressure               41 mmHg           <36 Aorta ---------------------------------------------------------------------- Name                                 Value        Normal ---------------------------------------------------------------------- Ascending Aorta ---------------------------------------------------------------------- Ao Root Diameter (2D)               2.6 cm               Ao Root Diam Index (2D)          1.3 cm/m2               Ascending Aorta Diameter            2.6 cm Venous ---------------------------------------------------------------------- Name                                 Value        Normal ---------------------------------------------------------------------- IVC/SVC ---------------------------------------------------------------------- IVC Diameter (Insp 2D)              1.3 cm               IVC Diameter (Exp 2D)               2.4 cm         <=2.1  IVC Diameter Percent Change (2D)                                  46 %          >=50 Report Signatures Finalized by Earla Maude Currier  MD on 09/02/2024 04:09 PM  XR Chest 2 views Result Date: 09/02/2024 Exam: Two-view chest  History:  Shortness of breath  Technique: Frontal and lateral views.  Comparison:  10/01/2020  Findings:    Lungs: The lungs are stable, negative for new focal and diffuse pulmonary opacities.  Mediastinum: Heart size is upper limits normal.  Pleural Spaces: The pleural spaces are normal.  Bones: The regional bones are intact.     Stable chest, negative for acute intrathoracic disease.  Signed (Electronic Signature): 09/02/2024 8:15 AM Signed By: Debby LITTIE Willy MICKEY, MD  CTA Chest W Contrast Result Date: 09/01/2024 Exam:  CT  Angiogram Chest (Pulmonary Embolism Protocol)  History:  Evaluate for pulmonary embolism, chest pain  Technique: Chest CTA with IV contrast (pulmonary embolism protocol). This examination was specifically tailored to evaluate the pulmonary arteries for the presence of intraluminal thrombus.  Volumetric axial CT Maximum Intensity Projection (MIP) pulmonary angiographic images were generated at the scanner and sent to PACS for review. AEC (automated exposure control) and/or manual techniques such as size-specific kV and mAs are employed where appropriate to reduce radiation exposure for all CT exams.  Comparison:  CT of the chest without contrast 01/01/2024  Chest CT Findings:  PULMONARY ARTERIES: No acute pulmonary embolism. Enlarged pulmonary trunk measuring 4.5 cm.  LUNGS: Background parenchyma normal. No groundglass or consolidation. Mild centrilobular emphysema. No suspicious pulmonary nodules. Central airways patent.  PLEURA: No pleural effusion or pneumothorax.  HEART: Mild multichamber cardiomegaly. No pericardial effusion. Mild three-vessel coronary  atherosclerosis.  AORTA: No acute pathology. Normal caliber. No significant atherosclerosis.  MEDIASTINUM: No lymphadenopathy.  BONES: No aggressive lesion. No acute abnormality.  SOFT TISSUES: Normal.  UPPER ABDOMEN: Limited view of the upper abdomen is unremarkable.    No acute pulmonary embolus. No consolidation or effusion. Mild centrilobular emphysema. Stable enlargement of the pulmonary trunk measuring 4.5 cm consistent with pulmonary arterial hypertension. Mild three-vessel coronary atherosclerosis.  Signed (Electronic Signature): 09/01/2024 9:59 PM Signed By: Sula Badger MD   Lab Results   Recent Labs    09/07/24 0435  WBC 8.4  HGB 16.2  HCT 52.3*  PLT 168   Recent Labs    09/07/24 0435  NA 138  K 5.3*  CL 99  CO2 35.3*  BUN 17  CREATININE 0.88  GLU 107  CALCIUM 8.7  MG 2.1   No results for input(s): CKTOTAL, CKMB, PCTCKMB,  TROPONINI, EDTPNI, BNP, INR, LABPROT, APTT, DDIMER in the last 72 hours. No results for input(s): WBCUA, NITRITE, LEUKOCYTESUR, BACTERIA, RBCUA, BLOODU, GLUCOSEU, PROTEINUA, KETONESU, KETUR in the last 72 hours. No results for input(s): OPIAU, BENZU, TRICYCLIC, PCPU, AMPHU, COCAU, CANNAU, BARBU, ETOH, ACETAMIN, SALICYLATE in the last 72 hours. No results for input(s): PREGTESTUR, PREGPOC in the last 72 hours. No results for input(s): OCCULTBLD, RAPSCRN, CDIFRPCR, CDIFFNAP1, A1C, CHOL, LDL, HDL, TRIG in the last 72 hours. No results for input(s): O2SOUR, FIO2ART, PHART, PCO2ART, PO2ART, HCO3ART, O2SATART, BEART in the last 72 hours. Pending Labs     Order Current Status   Lipoprotein a (LP(a)) In process   Lower Respiratory Culture Preliminary result       Home Medications   Prior to Admission medications  Medication Dose, Route, Frequency  atorvastatin (LIPITOR) 40 MG tablet 40 mg, Daily (standard)  isosorbide dinitrate (ISORDIL) 20 MG tablet 20 mg, Daily (standard)  olmesartan (BENICAR) 40 MG tablet 1 tablet, Daily (standard)  SYMBICORT 160-4.5 mcg/actuation inhaler 2 puffs, 2 times daily (RT)  tamsulosin (FLOMAX) 0.4 mg capsule 0.4 mg, Daily (standard)  albuterol HFA 90 mcg/actuation inhaler 2 puffs, Inhalation, Every 4 hours PRN  amlodipine (NORVASC) 10 MG tablet 10 mg, Daily (standard) Patient not taking: Reported on 09/01/2024  apixaban (ELIQUIS) 5 mg Tab 5 mg, Oral, 2 times a day (standard)  aspirin -acetaminophen  500-325 mg Pack 1 dose pack, Daily PRN Patient not taking: Reported on 09/01/2024  ciprofloxacin HCl (CIPRO) 500 MG tablet 500 mg, Oral, 2 times a day (standard)  dilTIAZem (CARDIZEM CD) 120 MG 24 hr capsule 120 mg, Oral, Daily (standard)  empagliflozin (JARDIANCE) 10 mg tablet 10 mg, Oral, Daily (standard)  furosemide (LASIX) 20 MG tablet 40 mg, Oral, Every morning, TAKE 20 MG  BY MOUTH EVERY MORNING.  ipratropium-albuterol (DUO-NEB) 0.5-2.5 mg/3 mL nebulizer 3 mL, Nebulization, Every 8 hours PRN  methocarbamoL (ROBAXIN) 500 MG tablet 1,000 mg, 4 times a day Patient not taking: Reported on 09/01/2024  metoPROLOL  tartrate (LOPRESSOR ) 25 MG tablet 12.5 mg, Oral, 2 times a day (standard)  naproxen (NAPROSYN) 500 MG tablet 500 mg, 2 times a day with meals Patient not taking: Reported on 09/01/2024  predniSONE (DELTASONE) 20 MG tablet Take 2 tablets (40 mg total) by mouth daily for 3 days, THEN 1 tablet (20 mg total) daily for 4 days, THEN 0.5 tablets (10 mg total) daily for 4 days.  spironolactone (ALDACTONE) 25 MG tablet 12.5 mg, Oral, 2 times a day (standard)   Brutus FORBES Shank, FNP Hospitalist, [facility] 09/07/24, 8:35 AM       [1] Past  Medical History: Diagnosis Date  . Arthritis   . COPD (chronic obstructive pulmonary disease) (CMS-HCC)   . Hypertension   . Kidney stone   [2] No past surgical history on file. [3] History reviewed. No pertinent family history. [4]  Current Facility-Administered Medications:  .  acetaminophen  (TYLENOL ) tablet 650 mg, 650 mg, Oral, Q4H PRN, Verdie Darin Shelvy Lonni, MD .  acetaminophen  (TYLENOL ) tablet 975 mg, 975 mg, Oral, Q8H, Verdie Darin Shelvy Lonni, MD, 975 mg at 09/07/24 [phone] .  albuterol 2.5 mg /3 mL (0.083 %) nebulizer solution 2.5 mg, 2.5 mg, Nebulization, Q4H PRN, Verdie Darin Shelvy Lonni, MD, 2.5 mg at 09/06/24 0805 .  aluminum-magnesium hydroxide-simethicone (MAALOX MAX) 80-80-8 mg/mL oral suspension, 30 mL, Oral, Q4H PRN, Verdie Darin Shelvy Lonni, MD .  apixaban [facility]) tablet 5 mg, 5 mg, Oral, BID, Pace, Hagan Eggleston, FNP, 5 mg at 09/06/24 2259 .  atorvastatin (LIPITOR) tablet 40 mg, 40 mg, Oral, Daily, Verdie Darin Shelvy Lonni, MD, 40 mg at 09/06/24 0902 .  dilTIAZem (CARDIZEM CD) 24 hr capsule 120 mg, 120 mg, Oral, Daily, Pace, Hagan Eggleston, FNP .  empagliflozin (JARDIANCE) tablet 10 mg, 10  mg, Oral, Daily, Quinn, Andrew Lee, FNP, 10 mg at 09/06/24 9096 .  fluticasone furoate-vilanterol (BREO ELLIPTA) 100-25 mcg/dose inhaler 1 puff, 1 puff, Inhalation, Daily (RT), Verdie Darin Shelvy Lonni, MD, 1 puff at 09/06/24 0801 .  [Provider Hold] furosemide (LASIX) injection 40 mg, 40 mg, Intravenous, BID, Pace, Hagan Eggleston, FNP, 40 mg at 09/04/24 9390 .  furosemide (LASIX) tablet 40 mg, 40 mg, Oral, Daily, Pace, Hagan Eggleston, FNP .  guaiFENesin (ROBITUSSIN) oral syrup, 200 mg, Oral, Q4H PRN, Verdie Darin Shelvy Lonni, MD .  ipratropium-albuterol (DUO-NEB) 0.5-2.5 mg/3 mL nebulizer solution 3 mL, 3 mL, Nebulization, Q6H (RT), Elisabeth Brutus Bryant, FNP, 3 mL at 09/07/24 0433 .  isosorbide dinitrate (ISORDIL) tablet 20 mg, 20 mg, Oral, Daily, Verdie Darin Shelvy Lonni, MD, 20 mg at 09/06/24 [phone] .  magnesium citrate solution 120 mL, 120 mL, Oral, Daily PRN, Verdie Darin Shelvy Lonni, MD .  melatonin tablet 3 mg, 3 mg, Oral, Nightly PRN, Verdie Darin Shelvy Lonni, MD .  metoPROLOL  (Lopressor ) injection 5 mg, 5 mg, Intravenous, Q5 Min PRN, Verdie Darin Shelvy Lonni, MD, 5 mg at 09/04/24 1755 .  metoPROLOL  tartrate (Lopressor ) tablet 12.5 mg, 12.5 mg, Oral, BID, Richarda Prentice Ruth, FNP, 12.5 mg at 09/06/24 2259 .  naloxone [facility]) injection 0.1 mg, 0.1 mg, Intravenous, Q5 Min PRN, Verdie Darin Shelvy Lonni, MD .  nicotine (NICODERM CQ) 21 mg/24 hr patch 1 patch, 1 patch, Transdermal, Daily, Verdie Darin Shelvy Lonni, MD, 1 patch at 09/06/24 [phone] .  nicotine polacrilex (NICORETTE) gum 4 mg, 4 mg, Buccal, Q1H PRN, Verdie Darin Shelvy Lonni, MD .  ondansetron (ZOFRAN) injection 4 mg, 4 mg, Intravenous, Q8H PRN **OR** ondansetron (ZOFRAN) injection 8 mg, 8 mg, Intravenous, Q8H PRN, Verdie Darin Shelvy Lonni, MD .  piperacillin-tazobactam (ZOSYN) 4.5 g in sodium chloride  0.9 % (NS) 100 mL IVPB-connector bag, 4.5 g, Intravenous, Q8H, Pace, Brutus Bryant, FNP, Stopped at 09/07/24  0430 .  polyethylene glycol (MIRALAX) packet 17 g, 17 g, Oral, Daily PRN, Verdie Darin Shelvy Lonni, MD .  predniSONE (DELTASONE) tablet 40 mg, 40 mg, Oral, Once, Pace, Brutus Bryant, FNP .  prochlorperazine (COMPAZINE) injection 2.5 mg, 2.5 mg, Intravenous, Q8H PRN **OR** prochlorperazine (COMPAZINE) injection 5 mg, 5 mg, Intravenous, Q8H PRN, Verdie Darin Shelvy Lonni, MD .  promethazine (PHENERGAN) suppository  12.5 mg, 12.5 mg, Rectal, Q6H PRN **OR** promethazine (PHENERGAN) suppository 25 mg, 25 mg, Rectal, Q6H PRN, Verdie Darin Shelvy Lonni, MD .  senna-docusate (PERICOLACE) 8.6-50 mg 1 tablet, 1 tablet, Oral, Nightly, Verdie Darin Shelvy Lonni, MD, 1 tablet at 09/02/24 2057 .  spironolactone (ALDACTONE) split tablet 12.5 mg, 12.5 mg, Oral, Daily, Pace, Hagan Eggleston, FNP, 12.5 mg at 09/06/24 0902 .  tamsulosin (FLOMAX) 24 hr capsule 0.4 mg, 0.4 mg, Oral, Daily, Verdie Darin Shelvy Lonni, MD, 0.4 mg at 09/06/24 0902 .  traMADol (ULTRAM) tablet 50 mg, 50 mg, Oral, Q4H PRN, Verdie Darin Shelvy Lonni, MD, 50 mg at 09/06/24 0207 .  trolamine salicylate (ASPERCREME) 10 % cream, , Topical, TID PRN, Pace, Hagan Eggleston, FNP .  [Provider Hold] valsartan (DIOVAN) tablet 160 mg, 160 mg, Oral, Daily, Verdie Darin Shelvy Lonni, MD, 160 mg at 09/02/24 [phone]

## 2024-09-07 NOTE — Nursing Note (Signed)
   09/07/24 1020  Final Assessment  Patient's Post Acute Contact Information See demo  Has a PCP appointment been made? Yes  Is appointment within 7 days of discharge? Yes  Has a specialist appointment been made? Yes  Post Acute Facility needed at discharge? No  Home Care/ Home Medical Equipment needed at discharge? Yes  Home Care/ Home Medical Equipment [facility] Supplies) (specify) (home oxygen)  HME Agency (Name/Phone #) [employer]  Outpatient/Community Referrals needed for discharge? No  Currently receiving outpatient dialysis? No  Discharge Disposition Home w/ Self Care  Transportation Anticipated family or friend will provide  Quality data for continuing care services shared with patient and/or representative? Yes  Patient and/or family were provided with choice of facilities / services that are available and appropriate to meet post hospital care needs? N/A  Final Assessment Complete  Final Assessment Complete Yes

## 2024-09-08 ENCOUNTER — Telehealth: Payer: Self-pay

## 2024-09-08 NOTE — Care Plan (Signed)
 Transition of Care Encounter Data   Call attempt: 1 Admission date: 09/02/24 Discharge date: 09/07/24 Discharge diagnosis: Respiratory insufficiency, volume overload, CHF with preserved EF acute on chronic Do you have a hospital follow up appointment?: Yes - Within 14 Days, Yes with Primary PCP clinic F/U Date: 09/14/24 F/U Provider: Yancey Reno, MD Patient post discharge: Medications:      SABRA   [facility]: [phone]:  .  [facility]: [phone]:  .  Other: Contact PCP:       [facility] TRANSITIONAL CASE MANAGEMENT SUMMARY NOTE   Attempted to contact patient today at Cell to complete Transitional Case Management call from [facility]. Left message for patient to return call; direct phone number included in message left for patient; 1st attempt.            Harlene JINNY Beets, RN

## 2024-09-08 NOTE — Transitions of Care (Post Inpatient/ED Visit) (Signed)
   09/08/2024  Name: Anthony Hobbs MRN: [id number] DOB: 01-05-1956  Today's TOC FU Call Status: Today's TOC FU Call Status:: Unsuccessful Call (1st Attempt) Unsuccessful Call (1st Attempt) Date: 09/08/24  Attempted to reach the patient regarding the most recent Inpatient/ED visit.  Follow Up Plan: Additional outreach attempts will be made to reach the patient to complete the Transitions of Care (Post Inpatient/ED visit) call.   Danette Weinfeld J. Cecia Egge RN, MSN [facility], [facility] RN Care Manager Direct Dial: [phone]  Fax: [phone] Website: [email or web address]

## 2024-09-08 NOTE — BH Treatment Plan (Signed)
-------------------------------------------------------------------------------   Summary: Patient discharge October 15; unable to fill his Lasix Rx due to Rx error.  Take 40 mg every AM & take 20 mg every AM.;  I rewrote Rx. -------------------------------------------------------------------------------  Addendum: 09/08/2024 => patient found wandering on the second floor wearing his portable oxygen and looking to speak with somebody.  He was discharged yesterday.  He states [facility] in [place] will not fill his prescription because the directions state take 40 mg every morning and take 20 mg every morning.  I reviewed the prescription and adjusted to reflect the desired dose of take 40 mg every morning.  He will be given 90-day supply.  I tried calling the pharmacy on 4 different occasions but I am unable to speak with any human person at [facility].  It only allows me to leave voicemail and they said that the pharmacy team will call me back within 1 hour.  Therefore, I corrected the prescription and send it in.  It now says the following furosemide 40 mg take 1 p.o. every morning dispense 90-day supply with no refill

## 2024-09-09 NOTE — Care Plan (Signed)
 Transition of Care Encounter Data   Call attempt: 1 Admission date: 09/02/24 Discharge date: 09/07/24 Discharge diagnosis: Respiratory insufficiency, volume overload, CHF with preserved EF acute on chronic Patient post discharge: Medications:      SABRA   [facility]: [phone]:  .  [facility]: [phone]:  .  Other: Contact PCP:        [facility] TRANSITIONAL CASE MANAGEMENT SUMMARY NOTE   Attempted to contact patient today at Home to complete Transitional Case Management call from [facility]. Left message for patient to return call; direct phone number included in message left for patient; 2nd attempt.  Letter sent to patient's last know address.   Koren Shines, BSN, RN

## 2024-09-12 ENCOUNTER — Encounter (HOSPITAL_COMMUNITY)
Admission: RE | Admit: 2024-09-12 | Discharge: 2024-09-12 | Disposition: A | Discharge status: Home / Self Care | Source: Ambulatory Visit | Attending: Urology | Admitting: Urology

## 2024-09-12 NOTE — Pre-Procedure Instructions (Signed)
 Attempted pre-op phone call. Left VM for him to call us back.

## 2024-09-13 ENCOUNTER — Telehealth: Payer: Self-pay

## 2024-09-13 DIAGNOSIS — I2510 Atherosclerotic heart disease of native coronary artery without angina pectoris: Secondary | ICD-10-CM | POA: Diagnosis not present

## 2024-09-13 DIAGNOSIS — J440 Chronic obstructive pulmonary disease with acute lower respiratory infection: Secondary | ICD-10-CM | POA: Diagnosis not present

## 2024-09-13 DIAGNOSIS — N200 Calculus of kidney: Secondary | ICD-10-CM | POA: Diagnosis not present

## 2024-09-13 DIAGNOSIS — I5022 Chronic systolic (congestive) heart failure: Secondary | ICD-10-CM | POA: Diagnosis not present

## 2024-09-13 DIAGNOSIS — Z6822 Body mass index (BMI) 22.0-22.9, adult: Secondary | ICD-10-CM | POA: Diagnosis not present

## 2024-09-13 DIAGNOSIS — I480 Paroxysmal atrial fibrillation: Secondary | ICD-10-CM | POA: Diagnosis not present

## 2024-09-13 NOTE — Progress Notes (Signed)
 Attempted pre-operative phone call.  Left voicemail.

## 2024-09-13 NOTE — Pre-Procedure Instructions (Signed)
 Spoke with Veleria Silversmith @ Dr [facility] office. She also has been having trouble with patient not answering his phone when she calls her back. She messaged me and I explained we have tried to contact patient and left him multiple messages to call us . Each time we call him, he does not answer the phone and we have to leave him a VM. She took pre-op information form us  and spoke with patient's sister in order to ensure he receives everything he needs.

## 2024-09-13 NOTE — Pre-Procedure Instructions (Signed)
 Attempted pre-op phone call. Left VM for him to call us  back. He was also let a VM this morning at  0843 to call us  back.

## 2024-09-13 NOTE — Transitions of Care (Post Inpatient/ED Visit) (Signed)
   09/13/2024  Name: TOMISLAV MICALE MRN: [id number] DOB: 12-05-1955  Today's TOC FU Call Status: Today's TOC FU Call Status:: Unsuccessful Call (2nd Attempt) Unsuccessful Call (2nd Attempt) Date: 09/13/24  Attempted to reach the patient regarding the most recent Inpatient/ED visit.  Follow Up Plan: Additional outreach attempts will be made to reach the patient to complete the Transitions of Care (Post Inpatient/ED visit) call.   Marypat Kimmet J. Latroy Gaymon RN, MSN [facility], [facility] RN Care Manager Direct Dial: [phone]  Fax: [phone] Website: [email or web address]

## 2024-09-14 ENCOUNTER — Telehealth: Payer: Self-pay

## 2024-09-14 NOTE — Transitions of Care (Post Inpatient/ED Visit) (Signed)
   09/14/2024  Name: Anthony Hobbs MRN: [id number] DOB: [birth year]  Today's TOC FU Call Status: Today's TOC FU Call Status:: Unsuccessful Call (3rd Attempt) Unsuccessful Call (3rd Attempt) Date: 09/14/24  Attempted to reach the patient regarding the most recent Inpatient/ED visit.  Follow Up Plan: No further outreach attempts will be made at this time. We have been unable to contact the patient.  Benoit Meech J. Vernona Peake RN, MSN [facility], [facility] RN Care Manager Direct Dial: [phone]  Fax: [phone] Website: [email or web address]

## 2024-09-15 ENCOUNTER — Other Ambulatory Visit: Payer: Self-pay

## 2024-09-15 ENCOUNTER — Encounter (HOSPITAL_COMMUNITY): Payer: Self-pay | Admitting: Anesthesiology

## 2024-09-15 ENCOUNTER — Ambulatory Visit (HOSPITAL_COMMUNITY)
Admission: RE | Admit: 2024-09-15 | Discharge: 2024-09-15 | Disposition: A | Discharge status: Home / Self Care | Attending: Urology | Admitting: Urology

## 2024-09-15 ENCOUNTER — Encounter (HOSPITAL_COMMUNITY): Payer: Self-pay | Admitting: Urology

## 2024-09-15 ENCOUNTER — Encounter (HOSPITAL_COMMUNITY)
Admission: RE | Disposition: A | Discharge status: Home / Self Care | Payer: Self-pay | Source: Home / Self Care | Attending: Urology

## 2024-09-15 DIAGNOSIS — Z538 Procedure and treatment not carried out for other reasons: Secondary | ICD-10-CM | POA: Diagnosis not present

## 2024-09-15 DIAGNOSIS — R531 Weakness: Secondary | ICD-10-CM | POA: Diagnosis not present

## 2024-09-15 DIAGNOSIS — I509 Heart failure, unspecified: Secondary | ICD-10-CM | POA: Diagnosis not present

## 2024-09-15 DIAGNOSIS — R0600 Dyspnea, unspecified: Secondary | ICD-10-CM | POA: Diagnosis not present

## 2024-09-15 DIAGNOSIS — F17210 Nicotine dependence, cigarettes, uncomplicated: Secondary | ICD-10-CM | POA: Diagnosis not present

## 2024-09-15 DIAGNOSIS — I110 Hypertensive heart disease with heart failure: Secondary | ICD-10-CM | POA: Diagnosis not present

## 2024-09-15 DIAGNOSIS — R069 Unspecified abnormalities of breathing: Secondary | ICD-10-CM | POA: Diagnosis not present

## 2024-09-15 DIAGNOSIS — R0602 Shortness of breath: Secondary | ICD-10-CM | POA: Diagnosis not present

## 2024-09-15 DIAGNOSIS — N210 Calculus in bladder: Secondary | ICD-10-CM | POA: Diagnosis not present

## 2024-09-15 DIAGNOSIS — J441 Chronic obstructive pulmonary disease with (acute) exacerbation: Secondary | ICD-10-CM | POA: Diagnosis not present

## 2024-09-15 DIAGNOSIS — Z79899 Other long term (current) drug therapy: Secondary | ICD-10-CM | POA: Diagnosis not present

## 2024-09-15 SURGERY — CYSTOSCOPY, WITH BLADDER CALCULUS LITHOLAPAXY
Anesthesia: General

## 2024-09-15 MED ORDER — LACTATED RINGERS IV SOLN: INTRAVENOUS | Status: DC

## 2024-09-15 MED ORDER — CEFAZOLIN SODIUM-DEXTROSE 2-4 GM/100ML-% IV SOLN: 2.0000 g | INTRAVENOUS | Status: DC

## 2024-09-15 MED ORDER — CHLORHEXIDINE GLUCONATE 0.12 % MT SOLN
15.0000 mL | Freq: Once | OROMUCOSAL | Status: AC
  Administered 2024-09-15: 15 mL via OROMUCOSAL

## 2024-09-15 SURGICAL SUPPLY — 16 items
BAG DRAIN URO TABLE W/ADPT NS (BAG) ×1 IMPLANT
BAG HAMPER (MISCELLANEOUS) ×1 IMPLANT
BAG URINE DRAIN 2000ML AR STRL (UROLOGICAL SUPPLIES) ×1 IMPLANT
CATH FOLEY 2WAY SLVR 5CC 18FR (CATHETERS) ×1 IMPLANT
GLOVE BIO SURGEON STRL SZ8 (GLOVE) ×1 IMPLANT
GLOVE BIOGEL PI IND STRL 7.0 (GLOVE) ×2 IMPLANT
GOWN STRL REUS W/TWL LRG LVL3 (GOWN DISPOSABLE) ×1 IMPLANT
GOWN STRL REUS W/TWL XL LVL3 (GOWN DISPOSABLE) ×1 IMPLANT
KIT TURNOVER CYSTO (KITS) ×1 IMPLANT
LASER FIBER DISP 1000U (UROLOGICAL SUPPLIES) IMPLANT
PACK CYSTO (CUSTOM PROCEDURE TRAY) ×1 IMPLANT
PAD ARMBOARD POSITIONER FOAM (MISCELLANEOUS) ×1 IMPLANT
POSITIONER HEAD 8X9X4 ADT (SOFTGOODS) ×1 IMPLANT
SOL .9 NS 3000ML IRR UROMATIC (IV SOLUTION) ×1 IMPLANT
TRACTIP FLEXIVA PULS ID 200XHI (Laser) IMPLANT
WATER STERILE IRR 500ML POUR (IV SOLUTION) ×1 IMPLANT

## 2024-09-15 NOTE — OR Nursing (Signed)
 Patient showed up for procedure. Nurse attempted to get pat ready. Patient unaware of his meds. Wearing a holter monitor he states he's suppose to go back to the heart doctor in a week for results.  He was placed on 2 liters of oxygen and sats maintained 92%.   Sister at bedside.   Dr. Kendell notified of patient condition and he spoke to Dr. Sherrilee. Decision was made to postpone surgery due to need of cardiac clearance,  Will be rescheduled after he gets results.  Patient was asked he he needed to go to ER due to his breathing and sob. Used his inhaler and stated he rather go home. Discharged with his sister via wheelchair.

## 2024-09-22 DIAGNOSIS — I10 Essential (primary) hypertension: Secondary | ICD-10-CM | POA: Diagnosis not present

## 2024-09-22 DIAGNOSIS — I4892 Unspecified atrial flutter: Secondary | ICD-10-CM | POA: Diagnosis not present

## 2024-09-22 DIAGNOSIS — I5030 Unspecified diastolic (congestive) heart failure: Secondary | ICD-10-CM | POA: Diagnosis not present

## 2024-10-05 ENCOUNTER — Ambulatory Visit: Admitting: Urology

## 2024-12-01 ENCOUNTER — Encounter (INDEPENDENT_AMBULATORY_CARE_PROVIDER_SITE_OTHER): Payer: Self-pay | Admitting: *Deleted

## 2024-12-29 ENCOUNTER — Encounter (INDEPENDENT_AMBULATORY_CARE_PROVIDER_SITE_OTHER): Payer: Self-pay | Admitting: *Deleted
